# Patient Record
Sex: Male | Born: 1985 | Race: White | Hispanic: No | Marital: Married | Smoking: Current every day smoker
Health system: Southern US, Community
[De-identification: ages and names within clinical notes are randomized; demographics above are authoritative.]

## PROBLEM LIST (undated history)

## (undated) DIAGNOSIS — M1990 Unspecified osteoarthritis, unspecified site: Secondary | ICD-10-CM

## (undated) DIAGNOSIS — K219 Gastro-esophageal reflux disease without esophagitis: Secondary | ICD-10-CM

## (undated) HISTORY — PX: OTHER SURGICAL HISTORY: SHX169

## (undated) HISTORY — DX: Gastro-esophageal reflux disease without esophagitis: K21.9

## (undated) HISTORY — PX: HERNIA REPAIR: SHX51

## (undated) HISTORY — PX: APPENDECTOMY: SHX54

## (undated) HISTORY — DX: Unspecified osteoarthritis, unspecified site: M19.90

---

## 2005-12-26 ENCOUNTER — Encounter: Admission: RE | Admit: 2005-12-26 | Discharge: 2005-12-26 | Payer: Self-pay | Admitting: Surgery

## 2006-02-12 ENCOUNTER — Emergency Department (HOSPITAL_COMMUNITY): Admission: EM | Admit: 2006-02-12 | Discharge: 2006-02-12 | Payer: Self-pay | Admitting: Emergency Medicine

## 2006-02-13 ENCOUNTER — Emergency Department (HOSPITAL_COMMUNITY): Admission: EM | Admit: 2006-02-13 | Discharge: 2006-02-13 | Payer: Self-pay | Admitting: Emergency Medicine

## 2006-03-19 ENCOUNTER — Ambulatory Visit: Payer: Self-pay | Admitting: Family Medicine

## 2006-09-03 ENCOUNTER — Ambulatory Visit: Payer: Self-pay | Admitting: Family Medicine

## 2009-07-08 ENCOUNTER — Emergency Department (HOSPITAL_COMMUNITY): Admission: EM | Admit: 2009-07-08 | Discharge: 2009-07-08 | Payer: Self-pay | Admitting: Emergency Medicine

## 2011-03-30 ENCOUNTER — Other Ambulatory Visit: Payer: Self-pay | Admitting: Surgery

## 2011-03-30 ENCOUNTER — Other Ambulatory Visit: Payer: Self-pay | Admitting: Orthopaedic Surgery

## 2011-03-30 DIAGNOSIS — R1032 Left lower quadrant pain: Secondary | ICD-10-CM

## 2011-04-02 ENCOUNTER — Ambulatory Visit
Admission: RE | Admit: 2011-04-02 | Discharge: 2011-04-02 | Disposition: A | Discharge status: Home / Self Care | Payer: Self-pay | Source: Ambulatory Visit | Attending: Surgery | Admitting: Surgery

## 2011-04-02 ENCOUNTER — Other Ambulatory Visit: Payer: Self-pay

## 2011-04-02 DIAGNOSIS — R1032 Left lower quadrant pain: Secondary | ICD-10-CM

## 2011-04-02 MED ORDER — GADOBENATE DIMEGLUMINE 529 MG/ML IV SOLN
14.0000 mL | Freq: Once | INTRAVENOUS | Status: AC | PRN
  Administered 2011-04-02: 14 mL via INTRAVENOUS

## 2011-04-14 ENCOUNTER — Encounter (HOSPITAL_COMMUNITY)
Admission: RE | Admit: 2011-04-14 | Discharge: 2011-04-14 | Disposition: A | Discharge status: Home / Self Care | Payer: Medicaid Other | Source: Ambulatory Visit | Attending: Surgery | Admitting: Surgery

## 2011-04-14 LAB — SURGICAL PCR SCREEN
MRSA, PCR: NEGATIVE
Staphylococcus aureus: NEGATIVE

## 2011-04-17 ENCOUNTER — Other Ambulatory Visit (HOSPITAL_COMMUNITY): Payer: Self-pay

## 2011-04-21 ENCOUNTER — Ambulatory Visit (HOSPITAL_COMMUNITY)
Admission: RE | Admit: 2011-04-21 | Discharge: 2011-04-21 | Disposition: A | Discharge status: Home / Self Care | Payer: Medicaid Other | Source: Ambulatory Visit | Attending: Surgery | Admitting: Surgery

## 2011-04-21 DIAGNOSIS — Z01812 Encounter for preprocedural laboratory examination: Secondary | ICD-10-CM | POA: Insufficient documentation

## 2011-04-21 DIAGNOSIS — Y832 Surgical operation with anastomosis, bypass or graft as the cause of abnormal reaction of the patient, or of later complication, without mention of misadventure at the time of the procedure: Secondary | ICD-10-CM | POA: Insufficient documentation

## 2011-04-21 DIAGNOSIS — T85898A Other specified complication of other internal prosthetic devices, implants and grafts, initial encounter: Secondary | ICD-10-CM | POA: Insufficient documentation

## 2011-04-21 DIAGNOSIS — R109 Unspecified abdominal pain: Secondary | ICD-10-CM | POA: Insufficient documentation

## 2011-04-21 DIAGNOSIS — G8929 Other chronic pain: Secondary | ICD-10-CM | POA: Insufficient documentation

## 2011-05-11 ENCOUNTER — Encounter (INDEPENDENT_AMBULATORY_CARE_PROVIDER_SITE_OTHER): Payer: Self-pay | Admitting: Surgery

## 2011-05-11 NOTE — Op Note (Signed)
NAME:  Kevin [name], Kevin [name]                  ACCOUNT NO.:  [id number]  MEDICAL RECORD NO.:  [id number]           PATIENT TYPE:  O  LOCATION:  [facility]                         FACILITY:  [facility]  PHYSICIAN:  Abigail Miyamoto, M.D. DATE OF BIRTH:  06/26/1986  DATE OF PROCEDURE:  04/21/2011 DATE OF DISCHARGE:  04/21/2011                              OPERATIVE REPORT   PREOPERATIVE DIAGNOSIS:  Chronic left groin pain.  POSTOPERATIVE DIAGNOSIS:  Chronic left groin pain.  PROCEDURE:  Left groin exploration with removal of old Prolene mesh and placement of new Prolene mesh.  SURGEON:  Abigail Miyamoto, MD  ANESTHESIA:  Marcaine 0.5% and bupivacaine.  ESTIMATED BLOOD LOSS:  Minimal.  INDICATIONS:  Kevin [name] is a 25 year old gentleman who had undergone a left inguinal hernia repair with mesh in 2003.  He has had chronic pain since then.  He has had a CAT scan of his abdomen and pelvis which was normal and now recently the MRI which was unremarkable.  There is a question of whether he has nerve entrapment.  He has failed conservative management, so after discussion with him he wished to proceed with groin exploration.  PROCEDURE IN DETAIL:  The patient was brought to the operating room, identified as Kevin [name] [place].  He was placed supine on the operative table and general anesthesia was induced.  His abdomen was then prepped and draped in the usual sterile fashion.  I performed an ilioinguinal nerve block with Marcaine, anesthetized the skin likewise with Marcaine.  I then used bupivacaine as well.  I made a small incision in the patient's left groin through previous scar.  I took this down through Scarpa fascia with electrocautery.  The external oblique fascia was found to be scarred down.  I was, however, able to open it with the electrocautery. The entire length of the incision.  The patient was found to have old Prolene mesh with what appeared to be Prolene sutures.  I removed the entire piece of  mesh in two separate sections.  I was able to peel it off the external oblique fascia.  There was no evidence of a recurrent hernia in the testicular cord structures.  I examined the groin in its entirety and identified no abnormal cysts or visible neuritis.  I could not identify an ilioinguinal nerve.  I felt like it was stuck up underneath the mesh and was removed with the mesh.  After extensive exploration of the groin, decision was made to place a new piece of mesh.  I was able to control the testicular cord structures with the Penrose drain.  I then placed a piece of Parietex ProGrip mesh into the groin around the cord structures.  I did not secure the mesh in with any sutures.  It appeared to stick to the surrounding tissue well.  I then closed the external oblique fascia over top of this with a running 2-0 Vicryl suture.  I then anesthetized the groin further with the bupivacaine.  I closed the Scarpa fascia with interrupted 3-0 Vicryl sutures, closed the skin with running 4-0 Monocryl.  Steri- Strips, gauze, and tape  were then applied.  The patient tolerated the procedure well.  All counts were correct at the end of the procedure. The patient was then extubated in the operating room and taken in a stable condition to recovery room.     Abigail Miyamoto, M.D.     DB/MEDQ  D:  04/21/2011  T:  04/22/2011  Job:  [id number]  Electronically Signed by Abigail Miyamoto M.D. on 05/11/2011 07:05:07 AM

## 2011-06-29 ENCOUNTER — Encounter (INDEPENDENT_AMBULATORY_CARE_PROVIDER_SITE_OTHER): Payer: Self-pay | Admitting: Surgery

## 2011-07-03 ENCOUNTER — Ambulatory Visit (INDEPENDENT_AMBULATORY_CARE_PROVIDER_SITE_OTHER): Payer: Medicaid Other | Admitting: Surgery

## 2011-07-03 ENCOUNTER — Encounter (INDEPENDENT_AMBULATORY_CARE_PROVIDER_SITE_OTHER): Payer: Self-pay | Admitting: Surgery

## 2011-07-03 DIAGNOSIS — Z09 Encounter for follow-up examination after completed treatment for conditions other than malignant neoplasm: Secondary | ICD-10-CM

## 2011-07-03 NOTE — Progress Notes (Signed)
Subjective:     Patient ID: Kevin [name], male   DOB: [birth year], 25 y.o.   MRN: [id number]  HPI He is here today for a followup visit. He still has occasional discomfort in his groin especially vigorous activity which is involved in a lot at work. This pain again is sharp in nature.  Review of Systems     Objective:   Physical Exam On exam, his incision is well-healed. There is some firmness to the incision especially just above this.    Assessment:     Patient with chronic left groin pain status post hernia repair    Plan:     I did offer him an ilioinguinal nerve block today to see if this would relieve his discomfort he declined. He would continue conservative measures. He will try ice also. I am going to see him back in 6 months for recheck.

## 2011-07-10 ENCOUNTER — Encounter (HOSPITAL_COMMUNITY): Payer: Self-pay | Admitting: *Deleted

## 2011-07-10 ENCOUNTER — Emergency Department (HOSPITAL_COMMUNITY)
Admission: EM | Admit: 2011-07-10 | Discharge: 2011-07-11 | Disposition: A | Discharge status: Home / Self Care | Payer: Medicaid Other | Attending: Emergency Medicine | Admitting: Emergency Medicine

## 2011-07-10 ENCOUNTER — Emergency Department (HOSPITAL_COMMUNITY): Payer: Medicaid Other

## 2011-07-10 DIAGNOSIS — G8918 Other acute postprocedural pain: Secondary | ICD-10-CM

## 2011-07-10 DIAGNOSIS — F17200 Nicotine dependence, unspecified, uncomplicated: Secondary | ICD-10-CM | POA: Insufficient documentation

## 2011-07-10 DIAGNOSIS — R1032 Left lower quadrant pain: Secondary | ICD-10-CM

## 2011-07-10 LAB — POCT I-STAT, CHEM 8
Calcium, Ion: 1.18 mmol/L (ref 1.12–1.32)
Chloride: 105 mEq/L (ref 96–112)
HCT: 44 % (ref 39.0–52.0)
Potassium: 4.6 mEq/L (ref 3.5–5.1)

## 2011-07-10 MED ORDER — HYDROCODONE-ACETAMINOPHEN 5-325 MG PO TABS: ORAL_TABLET | ORAL | Status: AC

## 2011-07-10 MED ORDER — IOHEXOL 300 MG/ML  SOLN
100.0000 mL | Freq: Once | INTRAMUSCULAR | Status: AC | PRN
  Administered 2011-07-10: 100 mL via INTRAVENOUS

## 2011-07-10 MED ORDER — IBUPROFEN 400 MG PO TABS
400.0000 mg | ORAL_TABLET | Freq: Once | ORAL | Status: AC
  Administered 2011-07-10: 400 mg via ORAL
  Filled 2011-07-10: qty 1

## 2011-07-10 MED ORDER — ACETAMINOPHEN 500 MG PO TABS
1000.0000 mg | ORAL_TABLET | Freq: Once | ORAL | Status: AC
  Administered 2011-07-10: 1000 mg via ORAL
  Filled 2011-07-10: qty 2

## 2011-07-10 MED ORDER — SODIUM CHLORIDE 0.9 % IV SOLN
INTRAVENOUS | Status: DC
  Administered 2011-07-10: 21:00:00 via INTRAVENOUS

## 2011-07-10 NOTE — ED Provider Notes (Signed)
History     CSN: [id number] Arrival date & time: 07/10/2011  7:30 PM  Chief Complaint  Patient presents with  . Abdominal Pain   HPI Pt was seen at 2010.  Per pt, c/o gradual onset and persistence of constant pain and "bulge" above his left inguinal hernia repair site for the past several months, worse over the past 2 weeks.  Describes the pain as "sharp," worsens with movement of his torso.  Pt states he has been eval by his Surgeon for this last week, and was told he was "fine," but he came to ED for further eval.  Denies N/V/D, no fevers, no rash, no flank/back pain, no penile drainage, no dysuria/hematuria, no testicular pain/swelling, no CP/SOB.     History reviewed. No pertinent past medical history.  Past Surgical History  Procedure Date  . Appendectomy   . Hernia repair     x 2    Family History  Problem Relation Age of Onset  . Diabetes Mother     History  Substance Use Topics  . Smoking status: Current Everyday Smoker -- 1.0 packs/day    Types: Cigarettes  . Smokeless tobacco: Current User  . Alcohol Use: No    Review of Systems ROS: Statement: All systems negative except as marked or noted in the HPI; Constitutional: Negative for fever and chills. ; ; Eyes: Negative for eye pain and discharge. ; ; ENMT: Negative for ear pain, hoarseness, nasal congestion, sinus pressure and sore throat. ; ; Cardiovascular: Negative for chest pain, palpitations, diaphoresis, dyspnea and peripheral edema. ; ; Respiratory: Negative for cough, wheezing and stridor. ; ; Gastrointestinal: Negative for nausea, vomiting, diarrhea and abdominal pain. +pain left inguinal area surgical site. ; Genitourinary: Negative for dysuria or hematuria, no flank pain and hematuria. No testicular pain/swelling, no penile drainage. ; Musculoskeletal: Negative for back pain and neck pain. ; ; Skin: Negative for rash and skin lesion. ; ; Neuro: Negative for headache, lightheadedness and neck stiffness.     Physical Exam  BP 136/74  Pulse 72  Temp(Src) 98.1 F (36.7 C) (Oral)  Resp 20  Ht 5\' 7"  (1.702 m)  Wt 168 lb (76.204 kg)  BMI 26.31 kg/m2  SpO2 100%  Physical Exam 2015: Physical examination:  Nursing notes reviewed; Vital signs and O2 SAT reviewed;  Constitutional: Well developed, Well nourished, Well hydrated, In no acute distress; Head:  Normocephalic, atraumatic; Eyes: EOMI, PERRL, No scleral icterus; ENMT: Mouth and pharynx normal, Mucous membranes moist; Neck: Supple, Full range of motion, No lymphadenopathy; Cardiovascular: Regular rate and rhythm, No murmur, rub, or gallop; Respiratory: Breath sounds clear & equal bilaterally, No rales, rhonchi, wheezes, or rub, Normal respiratory effort/excursion; Chest: Nontender, Movement normal; Abdomen: Soft, Nontender, Nondistended, Normal bowel sounds; Genitourinary: Genital performed with pt permission and male ED Tech chaparone present during exam.  No perineal erythema.  No penile lesions or drainage.  No scrotal erythema, edema or tenderness to palp.  Normal testicular lie.  No testicular tenderness to palp.  +cremasteric reflexes bilat.  No inguinal LAN or palpable masses.  +well healed surgical scar left inguinal area without erythema, drainage, or fluctuance.  +small nodular area just proximal to surgical scar, NT, no overlying erythema or ecchymosis.  Extremities: Pulses normal, No tenderness, No edema, No calf edema or asymmetry.; Neuro: AA&Ox3, Major CN grossly intact.  Gait steady.  No gross focal motor or sensory deficits in extremities.; Skin: Color normal, Warm, Dry.   ED Course  Procedures  MDM  MDM Reviewed: nursing note and vitals Interpretation: labs and CT scan   Results for orders placed during the hospital encounter of 07/10/11  POCT I-STAT, CHEM 8      Component Value Range   Sodium 141  135 - 145 (mEq/L)   Potassium 4.6  3.5 - 5.1 (mEq/L)   Chloride 105  96 - 112 (mEq/L)   BUN 15  6 - 23 (mg/dL)    Creatinine, Ser 1.61  0.50 - 1.35 (mg/dL)   Glucose, Bld 93  70 - 99 (mg/dL)   Calcium, Ion 0.96  0.45 - 1.32 (mmol/L)   TCO2 27  0 - 100 (mmol/L)   Hemoglobin 15.0  13.0 - 17.0 (g/dL)   HCT 40.9  81.1 - 91.4 (%)   Ct Abdomen Pelvis W Contrast  07/10/2011  *RADIOLOGY REPORT*  Clinical Data: Left inguinal hernia repairs.  Left groin pain. Abdominal pain.  CT ABDOMEN AND PELVIS WITH CONTRAST  Technique:  Multidetector CT imaging of the abdomen and pelvis was performed following the standard protocol during bolus administration of intravenous contrast.  Contrast: 100 ml Omnipaque-300  Comparison: 04/02/2011 MRI; 02/12/2006 CT scan  Findings: The liver, spleen, pancreas, and adrenal glands appear unremarkable.  The gallbladder and biliary system appear unremarkable.  The kidneys appear unremarkable, as do the proximal ureters.  No pathologic retroperitoneal or porta hepatis adenopathy is identified.  No pathologic pelvic adenopathy is identified.  Urinary bladder appears unremarkable.  Mild postoperative scarring in the left inguinal region noted, without recurrent hernia identified.  There is some chronic asymmetry of the rectus abdominus musculature, with the left larger than right.  IMPRESSION:  1.  Scarring along the inguinal regions, left greater than right, from prior hernia repair. 2.  Asymmetry of the inferior portions of the rectus abdominis muscles, chronic, with the left larger than the right. 3.  No specific abnormality is identified to account the patient's chronic left groin pain.  Original Report Authenticated By: Dellia Cloud, M.D.   11:37 PM:  CT A/P and labs normal.  No signs of recurrent hernia.  Will tx symptomatically for now.  Dx testing d/w pt.  Questions answered.  Verb understanding, agreeable to d/c home with outpt f/u.  Shahed Yeoman Allison Quarry, DO 07/11/11 1341

## 2011-07-10 NOTE — ED Notes (Signed)
Pt c/o pain and bulging in his left lower abdomen. Pt states that he has had hernia surgery x 2 previously and the bulging is above the incision sites. Pt denies nausea, vomiting and diarrhea.

## 2011-07-10 NOTE — ED Notes (Signed)
Pt sleeping in bed in room no noted distress. 

## 2011-07-11 NOTE — ED Notes (Signed)
Pt self ambulated out with a steady gait sdtating no needs

## 2011-07-25 ENCOUNTER — Encounter (INDEPENDENT_AMBULATORY_CARE_PROVIDER_SITE_OTHER): Payer: Self-pay | Admitting: Surgery

## 2012-01-01 ENCOUNTER — Ambulatory Visit (INDEPENDENT_AMBULATORY_CARE_PROVIDER_SITE_OTHER): Payer: Self-pay | Admitting: Surgery

## 2012-01-01 ENCOUNTER — Encounter (INDEPENDENT_AMBULATORY_CARE_PROVIDER_SITE_OTHER): Payer: Self-pay | Admitting: Surgery

## 2012-01-01 VITALS — BP 121/62 | HR 82 | Temp 97.8°F | Resp 18 | Ht 67.0 in | Wt 170.6 lb

## 2012-01-01 DIAGNOSIS — G8929 Other chronic pain: Secondary | ICD-10-CM

## 2012-01-01 DIAGNOSIS — R109 Unspecified abdominal pain: Secondary | ICD-10-CM

## 2012-01-01 NOTE — Progress Notes (Signed)
Subjective:     Patient ID: Kevin [name], male   DOB: July 06, 1986, 26 y.o.   MRN: [id number]  HPI He is here for long-term followup. It has been 6 months as I saw him last. He still does a lot of heavy lifting. He still has occasional discomfort in the left groin but is not every day. He denies any bulge. It will occasionally be sharp in nature.  Review of Systems     Objective:   Physical Exam    On exam, his incision is well-healed. It is nontender today. There is no evidence of recurrence. Assessment:     Patient with chronic left groin pain status post hernia repair    Plan:     From a surgical standpoint, there is nothing further to offer. He understands this may be permanent. He is not interested in seeing chronic pain physicians and not interested in any medications as this is not limiting his lifestyle. I will see him back as needed

## 2012-04-04 ENCOUNTER — Emergency Department
Admission: EM | Admit: 2012-04-04 | Discharge: 2012-04-04 | Disposition: A | Discharge status: Home / Self Care | Payer: Self-pay | Source: Home / Self Care | Attending: Emergency Medicine | Admitting: Emergency Medicine

## 2012-04-04 ENCOUNTER — Emergency Department
Admit: 2012-04-04 | Discharge: 2012-04-04 | Disposition: A | Discharge status: Home / Self Care | Payer: Self-pay | Attending: Emergency Medicine | Admitting: Emergency Medicine

## 2012-04-04 ENCOUNTER — Encounter: Payer: Self-pay | Admitting: *Deleted

## 2012-04-04 DIAGNOSIS — M25579 Pain in unspecified ankle and joints of unspecified foot: Secondary | ICD-10-CM

## 2012-04-04 NOTE — ED Notes (Signed)
Patient reports twisting his left foot/ankle on steps 4 days ago while running in the rain. Swelling and pain present.

## 2012-04-04 NOTE — ED Provider Notes (Signed)
History     CSN: [id number]  Arrival date & time 04/04/12  1113   First MD Initiated Contact with Patient 04/04/12 1136      Chief Complaint  Patient presents with  . Leg Pain    right    (Consider location/radiation/quality/duration/timing/severity/associated sxs/prior treatment) HPI This patient comes in today complaining of right ankle and might pain.  He was in the rain 4 days ago and slipped and fell in his right ankle and shin hit the stairs and got wedged in.  Since then he has had pain located mostly on the medial aspect of his right ankle and in his shin area.  He has used some Advil which has helped but has not used ice.  He did used to be in [employer] and states that he had a stress fracture on the opposite side but has never hurt the right ankle or leg before.  He feels it as a sharp throbbing sensation which is worse with certain movements of his ankle.  History reviewed. No pertinent past medical history.  Past Surgical History  Procedure Date  . Appendectomy   . Hernia repair     x 2    Family History  Problem Relation Age of Onset  . Diabetes Mother   . COPD Mother     History  Substance Use Topics  . Smoking status: Current Everyday Smoker -- 1.0 packs/day for 8 years    Types: Cigarettes  . Smokeless tobacco: Current User  . Alcohol Use: No      Review of Systems  All other systems reviewed and are negative.    Allergies  Review of patient's allergies indicates no known allergies.  Home Medications   Current Outpatient Rx  Name Route Sig Dispense Refill  . ACETAMINOPHEN 500 MG PO TABS Oral Take 1,000 mg by mouth as needed. For pain     . MULTIVITAMIN PO Oral Take 1 tablet by mouth daily.        BP 113/72  Pulse 69  Resp 16  Ht 5\' 7"  (1.702 m)  Wt 170 lb (77.111 kg)  BMI 26.63 kg/m2  SpO2 99%  Physical Exam  Nursing note and vitals reviewed. Constitutional: He is oriented to person, place, and time. He appears well-developed and  well-nourished.  Non-toxic appearance. He appears distressed (with ambulation -- antalgic gait).  HENT:  Head: Normocephalic and atraumatic.  Eyes: No scleral icterus.  Neck: Neck supple.  Cardiovascular: Regular rhythm and normal heart sounds.   Pulmonary/Chest: Effort normal and breath sounds normal. No respiratory distress.  Musculoskeletal:       R ankle/foot: FROM, +TTP medial>lateral malleolus and mildly medial aspect of anterior tibia.   No TTP navicular, base of 5th, calcaneus, Achilles, proximal fibula, or ATFL.  + Mild swelling distally.  No ecchymoses.  Distal neurovascular status is intact.   Neurological: He is alert and oriented to person, place, and time.  Skin: Skin is warm and dry.  Psychiatric: He has a normal mood and affect. His speech is normal.    ED Course  Procedures (including critical care time)  Labs Reviewed - No data to display Dg Ankle Complete Right  04/04/2012  *RADIOLOGY REPORT*  Clinical Data: Fall, pain.  RIGHT ANKLE - COMPLETE 3+ VIEW  Comparison: None.  Findings: Imaged bones, joints and soft tissues appear normal.  IMPRESSION: Negative exam.  Original Report Authenticated By: Bernadene Bell. D'ALESSIO, M.D.     1. Pain, joint, ankle and foot  MDM   An x-ray was ordered and read by the radiologist as above.  Likely bony contusions, not sprained. Encourage rest, ice, compression with ACE bandage and/or a brace, and elevation of injured body part.  The role of anti-inflammatories is discussed with the patient.  I discussed with him that he needs to probably give it about 7-10 more days and is still hurting by then will follow up with Dr. Pearletha Forge in sports medicine for further evaluation.   Marlaine Hind, MD 04/04/12 1209

## 2013-09-08 DIAGNOSIS — G5600 Carpal tunnel syndrome, unspecified upper limb: Secondary | ICD-10-CM | POA: Insufficient documentation

## 2013-10-20 ENCOUNTER — Emergency Department (HOSPITAL_COMMUNITY)
Admission: EM | Admit: 2013-10-20 | Discharge: 2013-10-20 | Disposition: A | Discharge status: Home / Self Care | Payer: Self-pay | Attending: Emergency Medicine | Admitting: Emergency Medicine

## 2013-10-20 ENCOUNTER — Encounter (HOSPITAL_COMMUNITY): Payer: Self-pay | Admitting: Emergency Medicine

## 2013-10-20 DIAGNOSIS — H579 Unspecified disorder of eye and adnexa: Secondary | ICD-10-CM | POA: Insufficient documentation

## 2013-10-20 DIAGNOSIS — F17200 Nicotine dependence, unspecified, uncomplicated: Secondary | ICD-10-CM | POA: Insufficient documentation

## 2013-10-20 NOTE — ED Notes (Signed)
PT stated " Am I going to see an actual Dr.? I don't want to be charged for one if I see a PA. How am I getting charged ?"

## 2013-10-20 NOTE — ED Notes (Signed)
Pt. Stated, i was fixing something in the Bathroom and something got in my rt. Eye.

## 2013-10-20 NOTE — ED Notes (Signed)
Pt left without discharge instructions.

## 2013-10-20 NOTE — ED Provider Notes (Signed)
CSN: [id number]     Arrival date & time 10/20/13  1713 History  This chart was scribed for Trixie Dredge, PA-C, working with Shon Baton, MD by Blanchard Kelch, ED Scribe. This patient was seen in room TR04C/TR04C and the patient's care was started at 6:10 PM.    Chief Complaint  Patient presents with  . Eye Injury    Patient is a 27 y.o. male presenting with eye injury. The history is provided by the patient. No language interpreter was used.  Eye Injury    HPI Comments: Kevin [name] is a 27 y.o. male who presents to the Emergency Department complaining of constant, mild eye discomfort that began six days ago. He describes the pain as an irritating feeling. He believes there may be a foreign body stuck in his eye. He denies that the FB has moved or grown. He denies any associated vision changes or discharge. He was burning brush at his house six days ago and may have gotten brush in his eye. States he is worried it is worm. He denies being in any freshwater streams, lakes or water recently. He states he has had a foreign body stuck in his eye years ago and had to get it flushed and wear a patch. However, he denies any similar eye problems to the currently CC in the past. He does not wear contacts and reports that he has 20/20 vision.    History reviewed. No pertinent past medical history. Past Surgical History  Procedure Laterality Date  . Appendectomy    . Hernia repair      x 2   Family History  Problem Relation Age of Onset  . Diabetes Mother   . COPD Mother    History  Substance Use Topics  . Smoking status: Current Every Day Smoker -- 1.00 packs/day for 8 years    Types: Cigarettes  . Smokeless tobacco: Current User  . Alcohol Use: No    Review of Systems  Constitutional: Negative for fever and chills.  Eyes: Positive for pain (irritation). Negative for discharge and visual disturbance.    Allergies  Review of patient's allergies indicates no known  allergies.  Home Medications   Current Outpatient Rx  Name  Route  Sig  Dispense  Refill  . acetaminophen (TYLENOL) 500 MG tablet   Oral   Take 1,000 mg by mouth every 8 (eight) hours as needed for mild pain. For pain         . HYDROcodone-acetaminophen (NORCO) 10-325 MG per tablet   Oral   Take 1 tablet by mouth every 6 (six) hours as needed for moderate pain.         . Multiple Vitamin (MULTIVITAMIN PO)   Oral   Take 1 tablet by mouth daily.            Triage Vitals: BP 123/79  Pulse 94  Temp(Src) 98.8 F (37.1 C)  SpO2 95%  Physical Exam  Nursing note and vitals reviewed. Constitutional: He appears well-developed and well-nourished. No distress.  HENT:  Head: Normocephalic and atraumatic.  Eyes: EOM are normal. Pupils are equal, round, and reactive to light. Right eye exhibits no discharge. Left eye exhibits no discharge. No scleral icterus.  Clear raised ring over right sclera at 8 o clock. No discharge, no injection.  Neck: Neck supple.  Pulmonary/Chest: Effort normal.  Neurological: He is alert.  Skin: He is not diaphoretic.    ED Course  Procedures (including critical care time)  DIAGNOSTIC STUDIES: Oxygen Saturation is 95% on room air, adequate by my interpretation.    COORDINATION OF CARE: 6:15 PM -Will consult with Dr. Wilkie Aye for treatment plan. Patient verbalizes understanding and agrees with treatment plan.    Labs Review Labs Reviewed - No data to display Imaging Review No results found.  EKG Interpretation   None       MDM   1. Eye lesion    Patient with clear round lesion within the sclera with central clearing. His and is not injected, he has no significant discomfort, there is a discharge. He has no systemic symptoms. He has no history of foreign body intruding into his eye. It is unclear what the lesion is. I was not able to perform a full eye exam with fluorescein stain as patient told the nurse after I had seen him that he  preferred to see a doctor. He was then seen by Dr. Wilkie Aye. Please see her notes for full details. Patient stated he had no visual changes and therefore declined to do visual acuity testing. After being seen by Dr. Wilkie Aye he did not want to stay for further workup. Do not think that the patient has a corneal abrasion given his history and the lesion that is present in his eye. I have referred him to an ophthalmologist for further evaluation and treatment. Return precautions given.   I personally performed the services described in this documentation, which was scribed in my presence. The recorded information has been reviewed and is accurate.    Trixie Dredge, PA-C 10/20/13 2159

## 2013-10-20 NOTE — ED Notes (Signed)
Informed ED PAc that Pt is requesting to see a MD.

## 2013-10-20 NOTE — ED Notes (Signed)
Pt refuses to do Visual acuity.

## 2013-10-21 NOTE — ED Provider Notes (Signed)
Medical screening examination/treatment/procedure(s) were conducted as a shared visit with non-physician practitioner(s) and myself.  I personally evaluated the patient during the encounter.  EKG Interpretation   None       Patient presents with right eye discomfort that began 6 days ago. He denies any injury. He feels like something is in his eye. Patient was evaluated by Fidela Salisbury, PA.  Patient is refusing visual field testing or floor seen staining. He is requesting to see a Dr. On my evaluation, the patient continues to refuse full ophthalmologic exam.  he does have a small irregularity at 9:00 of the sclera. There is no chemosis or conjunctival hemorrhage.  The patient was encouraged to submit to a full ophthalmologic exam to rule out abrasion, foreign body. Patient's continuing to refuse. He will be referred to ophthalmology for further evaluation.   Shon Baton, MD 10/21/13 [phone]

## 2014-02-23 ENCOUNTER — Emergency Department (HOSPITAL_COMMUNITY): Payer: Self-pay

## 2014-02-23 ENCOUNTER — Emergency Department (INDEPENDENT_AMBULATORY_CARE_PROVIDER_SITE_OTHER)
Admission: EM | Admit: 2014-02-23 | Discharge: 2014-02-23 | Disposition: A | Discharge status: Home / Self Care | Payer: Self-pay | Source: Home / Self Care | Attending: Emergency Medicine | Admitting: Emergency Medicine

## 2014-02-23 ENCOUNTER — Encounter (HOSPITAL_COMMUNITY): Payer: Self-pay | Admitting: Emergency Medicine

## 2014-02-23 ENCOUNTER — Emergency Department (INDEPENDENT_AMBULATORY_CARE_PROVIDER_SITE_OTHER): Payer: Self-pay

## 2014-02-23 DIAGNOSIS — J189 Pneumonia, unspecified organism: Secondary | ICD-10-CM

## 2014-02-23 LAB — POCT RAPID STREP A: Streptococcus, Group A Screen (Direct): NEGATIVE

## 2014-02-23 MED ORDER — GUAIFENESIN-CODEINE 100-10 MG/5ML PO SOLN: 10.0000 mL | ORAL | Status: DC | PRN

## 2014-02-23 MED ORDER — LIDOCAINE HCL (PF) 1 % IJ SOLN
INTRAMUSCULAR | Status: AC
  Filled 2014-02-23: qty 5

## 2014-02-23 MED ORDER — CEFTRIAXONE SODIUM 1 G IJ SOLR
1.0000 g | Freq: Once | INTRAMUSCULAR | Status: AC
  Administered 2014-02-23: 1 g via INTRAMUSCULAR

## 2014-02-23 MED ORDER — DEXAMETHASONE SODIUM PHOSPHATE 10 MG/ML IJ SOLN
INTRAMUSCULAR | Status: AC
  Filled 2014-02-23: qty 1

## 2014-02-23 MED ORDER — DEXAMETHASONE SODIUM PHOSPHATE 10 MG/ML IJ SOLN
10.0000 mg | Freq: Once | INTRAMUSCULAR | Status: AC
  Administered 2014-02-23: 10 mg via INTRAMUSCULAR

## 2014-02-23 MED ORDER — PREDNISONE 10 MG PO TABS: ORAL_TABLET | ORAL | Status: DC

## 2014-02-23 MED ORDER — CEFTRIAXONE SODIUM 1 G IJ SOLR
INTRAMUSCULAR | Status: AC
  Filled 2014-02-23: qty 10

## 2014-02-23 MED ORDER — AZITHROMYCIN 250 MG PO TABS: ORAL_TABLET | ORAL | Status: DC

## 2014-02-23 NOTE — Discharge Instructions (Signed)

## 2014-02-23 NOTE — ED Notes (Signed)
C/o cold sx which started friday States he has chills, fever, dry cough, nasal drainage States chest and throat hurts due to coughing advil was taken

## 2014-02-23 NOTE — ED Provider Notes (Signed)
Medical screening examination/treatment/procedure(s) were performed by non-physician practitioner and as supervising physician I was immediately available for consultation/collaboration.  Leslee Homeavid Romuald Mccaslin, M.D.  Reuben Likesavid C Tres Grzywacz, MD 02/23/14 2222

## 2014-02-23 NOTE — ED Provider Notes (Signed)
CSN: [id number]     Arrival date & time 02/23/14  0825 History   None    Chief Complaint  Patient presents with  . URI   (Consider location/radiation/quality/duration/timing/severity/associated sxs/prior Treatment) HPI Comments: 28 year old male presents complaining of fever to 101, sore throat, nasal congestion, body aches, chills, dry cough, right-sided pleuritic chest pain, generalized fatigue and weakness. This began on Friday. His symptoms continued to worsen up to today, although he says the fever seems to be gone today. He still feels very ill. He has been taking over-the-counter cough and cold medications with minimal relief of his symptoms. He has a sick contact with strep throat, although he states that his sore throat is actually better now. Feels like his symptoms are primarily localized to his chest and lungs now.  Patient is a 28 y.o. male presenting with URI.  URI Presenting symptoms: congestion, cough, fatigue, fever, rhinorrhea and sore throat   Presenting symptoms: no ear pain   Associated symptoms: myalgias   Associated symptoms: no arthralgias and no neck pain     History reviewed. No pertinent past medical history. Past Surgical History  Procedure Laterality Date  . Appendectomy    . Hernia repair      x 2   Family History  Problem Relation Age of Onset  . Diabetes Mother   . COPD Mother    History  Substance Use Topics  . Smoking status: Current Every Day Smoker -- 1.00 packs/day for 8 years    Types: Cigarettes  . Smokeless tobacco: Current User  . Alcohol Use: No    Review of Systems  Constitutional: Positive for fever, chills and fatigue.  HENT: Positive for congestion, rhinorrhea, sinus pressure and sore throat. Negative for ear pain.   Eyes: Negative for visual disturbance.  Respiratory: Positive for cough, chest tightness and shortness of breath.   Cardiovascular: Positive for chest pain (pleuritic). Negative for palpitations and leg swelling.   Gastrointestinal: Negative for nausea, vomiting, abdominal pain, diarrhea and constipation.  Genitourinary: Negative for dysuria, urgency, frequency, hematuria and flank pain.  Musculoskeletal: Positive for myalgias. Negative for arthralgias, back pain, neck pain and neck stiffness.  Skin: Negative for rash.  Neurological: Negative for dizziness, weakness and light-headedness.    Allergies  Review of patient's allergies indicates no known allergies.  Home Medications   Current Outpatient Rx  Name  Route  Sig  Dispense  Refill  . acetaminophen (TYLENOL) 500 MG tablet   Oral   Take 1,000 mg by mouth every 8 (eight) hours as needed for mild pain. For pain         . azithromycin (ZITHROMAX Z-PAK) 250 MG tablet      Use as directed   6 each   0   . guaiFENesin-codeine 100-10 MG/5ML syrup   Oral   Take 10 mLs by mouth every 4 (four) hours as needed for cough.   120 mL   0   . HYDROcodone-acetaminophen (NORCO) 10-325 MG per tablet   Oral   Take 1 tablet by mouth every 6 (six) hours as needed for moderate pain.         . Multiple Vitamin (MULTIVITAMIN PO)   Oral   Take 1 tablet by mouth daily.           . predniSONE (DELTASONE) 10 MG tablet      4 tabs PO QD for 4 days;  3 tabs PO QD for 3 days;  2 tabs PO QD for 2 days;  1 tab PO QD for 1 day   30 tablet   0    BP 127/72  Pulse 77  Temp(Src) 98.4 F (36.9 C) (Oral)  Resp 16  SpO2 98% Physical Exam  Nursing note and vitals reviewed. Constitutional: He is oriented to person, place, and time. He appears well-developed and well-nourished. No distress.  HENT:  Head: Normocephalic and atraumatic.  Nose: Mucosal edema present. Right sinus exhibits no maxillary sinus tenderness and no frontal sinus tenderness. Left sinus exhibits no maxillary sinus tenderness and no frontal sinus tenderness.  Mouth/Throat: Uvula is midline and mucous membranes are normal. Posterior oropharyngeal erythema present. No oropharyngeal  exudate.  Cardiovascular: Normal rate, regular rhythm and normal heart sounds.  Exam reveals no gallop and no friction rub.   No murmur heard. Pulmonary/Chest: Effort normal and breath sounds normal. No respiratory distress. He has no wheezes. He has no rales. He exhibits no tenderness.  Abdominal: Soft. There is no tenderness.  Lymphadenopathy:       Head (right side): No submandibular and no tonsillar adenopathy present.       Head (left side): No submandibular and no tonsillar adenopathy present.    He has no cervical adenopathy.       Right: No supraclavicular adenopathy present.       Left: No supraclavicular adenopathy present.  Neurological: He is alert and oriented to person, place, and time. Coordination normal.  Skin: Skin is warm and dry. No rash noted. He is not diaphoretic.  Psychiatric: He has a normal mood and affect. Judgment normal.    ED Course  Procedures (including critical care time) Labs Review Labs Reviewed  POCT RAPID STREP A ([facility]RG CARE ONLY)   Imaging Review Dg Chest 2 View  02/23/2014   CLINICAL DATA:  Chest pain, cough and congestion.  EXAM: CHEST - 2 VIEW  COMPARISON:  DG CHEST 2V dated 02/14/2012  FINDINGS: Perihilar bronchial thickening present. The lungs are normally expanded. Mild area of potentially early infiltrate versus atelectasis is present in the right infrahilar and lower lung. No pulmonary edema, nodule or pleural fluid is identified. No evidence of pneumothorax. Cardiac and mediastinal contours are within normal limits. Bony thorax is unremarkable.  IMPRESSION: Bronchial thickening with early infiltrate versus atelectasis in the right infrahilar/lower lung.   Electronically Signed   By: Irish Lack M.D.   On: 02/23/2014 09:28     MDM   1. CAP (community acquired pneumonia)    CXR and Hx consistent with CAP.  Giving rocephin and decadron here, discharging with azithromycin, prednisone, cheratussin.  Scheduled f/u here in 2 days for  re-check.  ED if worsening.     Meds ordered this encounter  Medications  . cefTRIAXone (ROCEPHIN) injection 1 g    Sig:   . dexamethasone (DECADRON) injection 10 mg    Sig:   . azithromycin (ZITHROMAX Z-PAK) 250 MG tablet    Sig: Use as directed    Dispense:  6 each    Refill:  0    Order Specific Question:  Supervising Provider    Answer:  Lorenz Coaster, DAVID C [id number]  . predniSONE (DELTASONE) 10 MG tablet    Sig: 4 tabs PO QD for 4 days;  3 tabs PO QD for 3 days;  2 tabs PO QD for 2 days;  1 tab PO QD for 1 day    Dispense:  30 tablet    Refill:  0    Order Specific Question:  Supervising Provider  Answer:  Lorenz Coaster, DAVID C [[id number]]  . guaiFENesin-codeine 100-10 MG/5ML syrup    Sig: Take 10 mLs by mouth every 4 (four) hours as needed for cough.    Dispense:  120 mL    Refill:  0    Order Specific Question:  Supervising Provider    Answer:  Lorenz Coaster, DAVID C [[id number]]       Graylon Good, PA-C 02/23/14 [phone]

## 2014-02-25 LAB — CULTURE, GROUP A STREP

## 2017-05-28 ENCOUNTER — Encounter (HOSPITAL_COMMUNITY): Payer: Self-pay

## 2017-05-28 ENCOUNTER — Ambulatory Visit (INDEPENDENT_AMBULATORY_CARE_PROVIDER_SITE_OTHER): Payer: Self-pay | Admitting: Orthopaedic Surgery

## 2017-05-28 ENCOUNTER — Emergency Department (HOSPITAL_COMMUNITY)
Admission: EM | Admit: 2017-05-28 | Discharge: 2017-05-28 | Disposition: A | Discharge status: Home / Self Care | Payer: Self-pay | Attending: Emergency Medicine | Admitting: Emergency Medicine

## 2017-05-28 ENCOUNTER — Emergency Department (HOSPITAL_COMMUNITY): Payer: Self-pay

## 2017-05-28 ENCOUNTER — Ambulatory Visit (INDEPENDENT_AMBULATORY_CARE_PROVIDER_SITE_OTHER): Payer: Self-pay

## 2017-05-28 DIAGNOSIS — M25511 Pain in right shoulder: Secondary | ICD-10-CM | POA: Insufficient documentation

## 2017-05-28 DIAGNOSIS — G8929 Other chronic pain: Secondary | ICD-10-CM

## 2017-05-28 DIAGNOSIS — F17210 Nicotine dependence, cigarettes, uncomplicated: Secondary | ICD-10-CM | POA: Insufficient documentation

## 2017-05-28 MED ORDER — PREDNISONE 10 MG (21) PO TBPK: ORAL_TABLET | ORAL | 0 refills | Status: DC

## 2017-05-28 NOTE — Discharge Instructions (Signed)
Please take Ibuprofen as needed for pain--follow over the counter label instructions--take with food to prevent GI upset.Please call to schedule a follow up appointment with your primary care doctor regarding today's ER visit, as well as orthopedics (please see listed number). Apply ice for 20 minutes four times a day--do not apply ice directly to skin--cover with towel. Return to the ER if you experience fevers, chills, unexplained weight loss, dizziness, vision or gait changes, chest pain, shortness of breath, abdominal pain, nausea, vomiting, diarrhea, extremity numbness or tingling, extremity weakness, redness/swelling/warmth to shoulder, worsening symptoms, or any additional concerns.

## 2017-05-28 NOTE — ED Triage Notes (Addendum)
Per Pt, pt is coming from home with complaints of right shoulder pain that started over a month ago and has gotten worse over time with movement.

## 2017-05-28 NOTE — ED Provider Notes (Signed)
[facility] Emergency Department Provider Note By signing my name below, I, Kevin [name], attest that this documentation has been prepared under the direction and in the presence of Wille Glaser, NP. Electronically Signed: Sonum [name], Neurosurgeon. 05/28/17. 10:11 AM.  ED Clinical Impression   Acute pain of right shoulder   History   Chief Complaint Shoulder Pain   HPI  Patient is a 31 y.o. male presenting to the ED for right shoulder pain, initial onset approx 1 month ago, gradually worsening, states the pain is worse with movement, has not tried OTC meds. States he works in [employer] with frequent heavy lifting and arm use, denies any known injury or trauma to the affected area. He is right hand dominant. Denies extremity numbness or tingling. Denies fevers, chills, unexplained weight loss, dizziness, vision or gait changes, headaches, neck pain, CP, SOB, cough, abd pain, n/v/d, extremity numbness/tingling, extremity weakness, or any additional concerns. Hx of right wrist surgeries, no previous shoulder surgeries.    History reviewed. No pertinent past medical history.  Past Surgical History:  Procedure Laterality Date  . APPENDECTOMY    . HERNIA REPAIR     x 2  . Wrist surgery       Current Outpatient Rx  . Order #: [id number] Class: Historical Med  . Order #: [id number] Class: Print  . Order #: [id number] Class: Print  . Order #: [id number] Class: Historical Med  . Order #: [id number] Class: Historical Med  . Order #: [id number] Class: Print    Allergies Patient has no known allergies.  Family History  Problem Relation Age of Onset  . Diabetes Mother   . COPD Mother     Social History Social History  Substance Use Topics  . Smoking status: Current Every Day Smoker    Packs/day: 1.00    Years: 8.00    Types: Cigarettes  . Smokeless tobacco: Current User  . Alcohol use No    Review of Systems  Constitutional: Negative for fever, chills, or unexplained weight loss. Eyes:  Negative for visual changes. Cardiovascular: Negative for chest pain or extremity swelling. Respiratory: Negative for shortness of breath or cough. Gastrointestinal: Negative for abdominal pain, nausea, vomiting, or diarrhea. Musculoskeletal: Positive shoulder pain. Negative for back pain or extremity swelling. Skin: Negative for rash. Neurological: Negative for headaches, dizziness, focal weakness, or numbness/tingling.  Physical Exam   VITAL SIGNS:   ED Triage Vitals  Enc Vitals Group     BP 05/28/17 0900 (!) 131/94     Pulse Rate 05/28/17 0900 84     Resp 05/28/17 0900 18     Temp 05/28/17 0900 98.5 F (36.9 C)     Temp Source 05/28/17 0900 Oral     SpO2 05/28/17 0900 98 %     Weight 05/28/17 0901 165 lb (74.8 kg)     Height 05/28/17 0901 5\' 7"  (1.702 m)     Head Circumference --      Peak Flow --      Pain Score 05/28/17 0900 0     Pain Loc --      Pain Edu? --      Excl. in GC? --     Constitutional: Alert and oriented. Well appearing and in no respiratory apparent distress. Eyes: PERRL, EOMI, Conjunctivae normal ENT      Head: Normocephalic and atraumatic.      Ears: TM intact bilaterally without erythema or effusion.      Mouth/Throat: Mucous membranes are moist. Oropharynx without erythema or exudate. Normal voice, handling  secretions normally.      Neck: Supple, no nuchal signs, full active ROM of neck.  Cardiovascular: Normal S1 S2, regular rhythm, normal rate. Normal and symmetric distal pulses are present in all extremities. Respiratory: Breath sounds clear and equal bilaterally. No wheezes, rales, or rhonchi. Normal respiratory effort.  Gastrointestinal: Abdomen soft and nontender. No rebound or guarding. There is no CVA tenderness. Back: No midline tenderness, no stepoff.  Musculoskeletal: +R anterior shoulder mildly tender to palpation, full active ROM with increased pain with abduction, normal adduction, no deformity, skin intact without  erythema/warmth/ecchymosis. No STS. AC joint nontender, clavicle nontender. Negative Hawkins and drop arm test. Sharp and dull sensation grossly intact to bilateral UE. Strength 5/5 to bilateral UE. Radial pulses 2+, cap refill < 2sec. Nontender with normal range of motion in all other extremities. Neurologic: Speech clear. Alert and appropriate, no gross focal neurologic deficits are appreciated. Gait steady with ambulation. Equal strength in all four extremities. Extremities neurovascularly intact.  Skin: Skin is warm, dry, and intact. No rash noted. Psychiatric: Mood and affect are normal. Speech and behavior are normal.  Labs   Labs Reviewed - No data to display  Radiology   DG SHOULDER RIGHT   ED Course, Assessment and Plan  Pt is a 31 y/o M, afebrile, who presents to ED for right shoulder pain, concern for strain. Not c/w septic joint or osteomyelitis. Will get xray for acute fracture. No evidence of neurovascular compromise. Compartments soft to RUE, doubt compartment syndrome. If xray negative for acute fracture/dislocation, will plan to dc with symptomatic tx and orthopedic follow up; pt amenable to this plan.  10:45 AM Xray with minimal acromioclavicular joint bony overgrowth, no acute fracture or dislocation. Discussed results, discharge instructions, rx and safety, return precautions, and follow up. Pt verbalizes understanding using verbal teachback and agrees with plan, denies any additional concerns.   Previous chart, nursing notes, and vital signs reviewed.    Pertinent labs & imaging results that were available during my care of the patient were reviewed by me and considered in my medical decision making (see chart for details).  I personally performed the services described in this documentation, which was scribed in my presence. The recorded information has been reviewed and is accurate.    Wojeck, HopeRobyn K, NP 05/30/17 [id number]    Linwood DibblesKnapp, Jon, MD 05/30/17 [phone]

## 2017-05-28 NOTE — Progress Notes (Signed)
Office Visit Note   Patient: Kevin [name]           Date of Birth: 08/09/1986           MRN: [id number] Visit Date: 05/28/2017              Requested by: No referring provider defined for this encounter. PCP: Patient, No Pcp Per   Assessment & Plan: Visit Diagnoses:  1. Chronic right shoulder pain     Plan: Overall impression is right shoulder subacromial bursitis and impingement. Patient declined injection. I offered him prednisone taper which she agreed to. Also recommend ice and relative rest. Follow-up with me as needed. Questions encouraged and answered.  Follow-Up Instructions: Return if symptoms worsen or fail to improve.   Orders:  Orders Placed This Encounter  Procedures  . XR Shoulder 1V Right   Meds ordered this encounter  Medications  . predniSONE (STERAPRED UNI-PAK 21 TAB) 10 MG (21) TBPK tablet    Sig: Take as directed    Dispense:  21 tablet    Refill:  0      Procedures: No procedures performed   Clinical Data: No additional findings.   Subjective: Chief Complaint  Patient presents with  . Right Shoulder - Pain    Kevin is a healthy 31 year old woman comes in with right shoulder pain that's progressively gotten worse over the last 2 weeks. He has a physically demanding job. He denies any particular injuries. He endorses pain on the superior aspect of the shoulder. Pain does not radiate. Denies any numbness or tingling or neck pain.    Review of Systems  Constitutional: Negative.   All other systems reviewed and are negative.    Objective: Vital Signs: There were no vitals taken for this visit.  Physical Exam  Constitutional: He is oriented to person, place, and time. He appears well-developed and well-nourished.  HENT:  Head: Normocephalic and atraumatic.  Eyes: Pupils are equal, round, and reactive to light.  Neck: Neck supple.  Pulmonary/Chest: Effort normal.  Abdominal: Soft.  Musculoskeletal: Normal range of motion.    Neurological: He is alert and oriented to person, place, and time.  Skin: Skin is warm.  Psychiatric: He has a normal mood and affect. His behavior is normal. Judgment and thought content normal.  Nursing note and vitals reviewed.   Ortho Exam Right shoulder exam shows no significant impingement signs. Rotator cuff testing is strong with moderate pain. No deficits. Acromioclavicular joint is tender. Mildly positive cross adduction sign. Specialty Comments:  No specialty comments available.  Imaging: Dg Shoulder Right  Result Date: 05/28/2017 CLINICAL DATA:  31 year old male with right shoulder pain starting 1 month ago getting worse. No reported trauma. Initial encounter. EXAM: RIGHT SHOULDER - 2+ VIEW COMPARISON:  None. FINDINGS: Minimal acromioclavicular joint bony overgrowth. No soft tissue calcification. No fracture or dislocation. Visualized lungs clear. IMPRESSION: Minimal acromioclavicular joint bony overgrowth. Otherwise negative exam. Electronically Signed   By: Lacy DuverneySteven  Olson M.D.   On: 05/28/2017 09:36   Xr Shoulder 1v Right  Result Date: 05/28/2017 No significant acromioclavicular joint arthropathy. small dorsal spur of the acromion    PMFS History: There are no active problems to display for this patient.  No past medical history on file.  Family History  Problem Relation Age of Onset  . Diabetes Mother   . COPD Mother     Past Surgical History:  Procedure Laterality Date  . APPENDECTOMY    . HERNIA REPAIR  x 2  . Wrist surgery      Social History   Occupational History  . Not on file.   Social History Main Topics  . Smoking status: Current Every Day Smoker    Packs/day: 1.00    Years: 8.00    Types: Cigarettes  . Smokeless tobacco: Current User  . Alcohol use No  . Drug use: No  . Sexual activity: Not on file

## 2017-10-09 DIAGNOSIS — Z8739 Personal history of other diseases of the musculoskeletal system and connective tissue: Secondary | ICD-10-CM | POA: Insufficient documentation

## 2017-10-09 NOTE — Progress Notes (Signed)
Subjective: ZO:XWRUEAVWUCC:establish care, back pain HPI: [place] A Jone Basemanurdy is a 31 y.o. male presenting to clinic today for:  1. Back pain Patient reports a long-standing history of low back pain.  He notes that back pain became acutely worse a couple of weeks ago and he sought evaluation in the emergency department.  He had lumbar films performed at that time which were unremarkable.  They proceeded with MRI of the lumbar spine which revealed L5 through S1 disc degeneration and desiccation.  They noted a shallow broad-based central disc protrusion with slight asymmetry possibly contacting the right S1 nerve root.  The rest of the exam was unremarkable.  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  His job often requires him to bend, push, pull, lift heavy objects.  He does note that the low back pain tends to radiate to the right lower extremity.  He denies numbness, tingling, saddle anesthesia, fecal incontinence, urinary retention, weakness.  He was given a steroid burst during that visit.  He did report some improvement of symptoms and notes that they are substantially better than Sunday.  He has never seen a physical therapist or orthopedist for this issue.  He he notes that he would like to avoid surgical intervention if possible.  Not currently taking any medications except for occasional ibuprofen for headaches.  Past Medical History:  Diagnosis Date  . Arthritis   . GERD (gastroesophageal reflux disease)    Past Surgical History:  Procedure Laterality Date  . APPENDECTOMY    . HERNIA REPAIR Left    x 2 inguinal  . Wrist surgery      Social History   Socioeconomic History  . Marital status: Married    Spouse name: Not on file  . Number of children: Not on file  . Years of education: Not on file  . Highest education level: Not on file  Social Needs  . Financial resource strain: Not on file  . Food insecurity - worry: Not on file  . Food insecurity - inability: Not on file    . Transportation needs - medical: Not on file  . Transportation needs - non-medical: Not on file  Occupational History  . Not on file  Tobacco Use  . Smoking status: Current Every Day Smoker    Packs/day: 1.00    Years: 8.00    Pack years: 8.00    Types: Cigarettes  . Smokeless tobacco: Current User  Substance and Sexual Activity  . Alcohol use: No  . Drug use: No  . Sexual activity: Yes  Other Topics Concern  . Not on file  Social History Narrative  . Not on file   No outpatient medications have been marked as taking for the 10/10/17 encounter (Office Visit) with Raliegh IpGottschalk, Daronte Shostak M, DO.   Family History  Problem Relation Age of Onset  . Diabetes Mother   . COPD Mother   . Heart attack Maternal Grandmother   . Heart disease Maternal Grandmother   . Breast cancer Neg Hx   . Prostate cancer Neg Hx   . Ovarian cancer Neg Hx    No Known Allergies   Health Maintenance: Flu shot  ROS: Per HPI  Objective: Office vital signs reviewed. BP 130/84   Pulse 94   Temp 97.7 F (36.5 C) (Oral)   Ht 5\' 7"  (1.702 m)   Wt 179 lb (81.2 kg)   BMI 28.04 kg/m   Physical Examination:  General: Awake, alert, well nourished, well  appearing male, No acute distress Cardio: regular rate and rhythm, S1S2 heard, no murmurs appreciated Pulm: clear to auscultation bilaterally, no wheezes, rhonchi or rales; normal work of breathing on room air Extremities: warm, well perfused, No edema, cyanosis or clubbing; +2 pulses bilaterally MSK: Normal gait and normal station  Lumbar spine: Patient has full active, painless range of motion in all planes.  No midline tenderness to palpation to the entire spine.  No paraspinal muscle tenderness to palpation noted.  No palpable bony abnormalities on exam.  He has negative straight leg raises bilaterally. Neuro: 5/5 lower extremity strength and light touch sensation grossly intact, no focal neurologic deficits.  Assessment/ Plan: 31 y.o. male    Degenerative disc disease at L5-S1 level Copy of MRI disc was provided at today's visit.  [facility].  The report was reviewed which again revealed shallow broad-based disc protrusion at L5 through S1 with slight asymmetric and possible contact to the right S1 nerve root.  This is likely the etiology of patient's symptoms.  He demonstrates no red flag symptoms and is actually fairly asymptomatic during today's visit.  He would like to do everything he can to avoid surgery.  We did discuss consideration for referral to physical therapy.  He is currently uninsured and finances are limiting.  I have initiated meloxicam 7.5-15 mg daily as needed for back pain.  I recommended that he avoid all other NSAIDs, take the medication with a meal and drink plenty of water.  I have also started him on gabapentin 300 mg to be titrated up to 3 times daily as tolerated for the associated sciatica.  If pain persists or worsens, will I did discuss that the next step would be physical therapy and referral to orthopedic surgery for further evaluation and management.  He is working on Community education officerinsurance in anticipation for possible need of this.  He voiced good understanding of plan and will follow-up in the next 4 weeks or sooner if needed.  We will plan to obtain updated CMP at next visit.  Encounter to establish care with new doctor  Sciatica of right side See above.   Raliegh IpAshly M Farrah Skoda, DO [facility] [phone]

## 2017-10-10 ENCOUNTER — Encounter: Payer: Self-pay | Admitting: Family Medicine

## 2017-10-10 ENCOUNTER — Ambulatory Visit (INDEPENDENT_AMBULATORY_CARE_PROVIDER_SITE_OTHER): Payer: Self-pay | Admitting: Family Medicine

## 2017-10-10 VITALS — BP 130/84 | HR 94 | Temp 97.7°F | Ht 67.0 in | Wt 179.0 lb

## 2017-10-10 DIAGNOSIS — Z7689 Persons encountering health services in other specified circumstances: Secondary | ICD-10-CM

## 2017-10-10 DIAGNOSIS — M5137 Other intervertebral disc degeneration, lumbosacral region: Secondary | ICD-10-CM

## 2017-10-10 DIAGNOSIS — M51379 Other intervertebral disc degeneration, lumbosacral region without mention of lumbar back pain or lower extremity pain: Secondary | ICD-10-CM | POA: Insufficient documentation

## 2017-10-10 DIAGNOSIS — M5136 Other intervertebral disc degeneration, lumbar region: Secondary | ICD-10-CM

## 2017-10-10 DIAGNOSIS — M5431 Sciatica, right side: Secondary | ICD-10-CM

## 2017-10-10 DIAGNOSIS — M5430 Sciatica, unspecified side: Secondary | ICD-10-CM | POA: Insufficient documentation

## 2017-10-10 MED ORDER — GABAPENTIN 300 MG PO CAPS: ORAL_CAPSULE | ORAL | 0 refills | Status: DC

## 2017-10-10 MED ORDER — MELOXICAM 15 MG PO TABS: 7.5000 mg | ORAL_TABLET | Freq: Every day | ORAL | 0 refills | Status: DC

## 2017-10-10 NOTE — Patient Instructions (Signed)
Follow up with me in 4 weeks for your back pain.  As we discussed, the next step is physical therapy followed by referral to orthopedic surgery.  I have prescribed 2 medications today: 1. Gabapentin: take 1 capsule at bedtime x3 nights, then increase to twice daily x3 days, then 3 times daily.  This will help with pain that goes down your leg.  2. Meloxicam: Take 1/2-1 tablet daily for pain and inflammation. No motrin, advil, aleve while on this medication.  Take with food and drink plenty of water.

## 2017-10-10 NOTE — Assessment & Plan Note (Signed)
Copy of MRI disc was provided at today's visit.  This imaging study was [facility] with [facility].  The report was reviewed which again revealed shallow broad-based disc protrusion at L5 through S1 with slight asymmetric and possible contact to the right S1 nerve root.  This is likely the etiology of patient's symptoms.  He demonstrates no red flag symptoms and is actually fairly asymptomatic during today's visit.  He would like to do everything he can to avoid surgery.  We did discuss consideration for referral to physical therapy.  He is currently uninsured and finances are limiting.  I have initiated meloxicam 7.5-15 mg daily as needed for back pain.  I recommended that he avoid all other NSAIDs, take the medication with a meal and drink plenty of water.  I have also started him on gabapentin 300 mg to be titrated up to 3 times daily as tolerated for the associated sciatica.  If pain persists or worsens, will I did discuss that the next step would be physical therapy and referral to orthopedic surgery for further evaluation and management.  He is working on Community education officerinsurance in anticipation for possible need of this.  He voiced good understanding of plan and will follow-up in the next 4 weeks or sooner if needed.  We will plan to obtain updated CMP at next visit.

## 2017-10-17 ENCOUNTER — Telehealth: Payer: Self-pay | Admitting: Family Medicine

## 2017-10-17 NOTE — Telephone Encounter (Signed)
As we discussed during our appt.  If he medication makes him drowsy, he should only take at night time until he is no longer having this side effect.  Recommend that he take only at night time for now.  Continue the meloxicam as prescribed.

## 2017-10-17 NOTE — Telephone Encounter (Signed)
Phone call to patient- no answer, unable to leave voicemail. 

## 2017-10-17 NOTE — Telephone Encounter (Signed)
Patient states gabapentin makes him very drowsy and he is nervous to take this during the day due to his work.  He is wondering if there is something for nerve irritation that he can take that will not make him as drowsy, and that he can take as needed?  Please advise.

## 2017-10-17 NOTE — Telephone Encounter (Signed)
Advised patient of advice from Dr. Nadine CountsGottschalk.  He states he does not think the gabapentin is helping him and he does not want to be on a daily regimen.  Advised him that it takes time for the gabapentin to work and many times the dosage has to be adjusted for it to be fully effective.  Advised I will be glad to make him a follow up appointment with Dr. Nadine CountsGottschalk to discuss other options.  He states he is paying out of pocket for his appointments, and will wait to discuss at his appointment 11/07/17.  He asks what he should do when he has pain during the day.  Advised he should take meloxicam for daytime discomfort.

## 2017-11-07 ENCOUNTER — Encounter: Payer: Self-pay | Admitting: Physician Assistant

## 2017-11-07 ENCOUNTER — Ambulatory Visit: Payer: Self-pay | Admitting: Family Medicine

## 2017-11-07 ENCOUNTER — Ambulatory Visit (INDEPENDENT_AMBULATORY_CARE_PROVIDER_SITE_OTHER): Payer: Self-pay | Admitting: Physician Assistant

## 2017-11-07 VITALS — BP 128/84 | HR 94 | Temp 97.9°F | Ht 67.0 in | Wt 181.6 lb

## 2017-11-07 DIAGNOSIS — M5126 Other intervertebral disc displacement, lumbar region: Secondary | ICD-10-CM

## 2017-11-07 MED ORDER — HYDROCODONE-ACETAMINOPHEN 10-325 MG PO TABS: 1.0000 | ORAL_TABLET | Freq: Three times a day (TID) | ORAL | 0 refills | Status: DC | PRN

## 2017-11-07 MED ORDER — GABAPENTIN 100 MG PO CAPS: 100.0000 mg | ORAL_CAPSULE | Freq: Every day | ORAL | 2 refills | Status: DC

## 2017-11-07 MED ORDER — MELOXICAM 15 MG PO TABS: 7.5000 mg | ORAL_TABLET | Freq: Every day | ORAL | 5 refills | Status: DC

## 2017-11-07 NOTE — Patient Instructions (Signed)
Herniated Disk A herniated disk is when a disk in your spine bulges out too far. There is a disk with a spongy center in between each pair of bones in the spine (vertebrae). These disks act as shock absorbers when you move. A herniated disk can cause pain and muscle weakness. This can happen anywhere in the back or neck. Follow these instructions at home: Medicines  Take over-the-counter and prescription medicines only as told by your doctor.  Do not drive or use heavy machinery while taking prescription pain medicine. Activity  Rest as told by your doctor.  After your rest period: ? Return to your normal activities. Slowly start exercising as told by your doctor. Ask what activities are safe for you. ? Use good posture. ? Avoid movements that cause pain. ? Do not lift anything that is heavier than 10 lb (4.5 kg) until your doctor says this is safe. ? Do not sit or stand for a long time without moving. ? Do not sit for a long time without getting up and moving around.  Do exercises (physical therapy) as told.  Try to strengthen your back and belly (abdomen) with exercises like crunches, swimming, or walking. General instructions  Do not use any products that contain nicotine or tobacco, such as cigarettes and e-cigarettes. If you need help quitting, ask your doctor.  Do not wear high-heeled shoes.  Do not sleep on your belly.  If you are overweight, work with your doctor to lose weight safely.  To prevent or treat constipation while you are taking prescription pain medicine, your doctor may recommend that you: ? Drink enough fluid to keep your pee (urine) clear or pale yellow. ? Take over-the-counter or prescription medicines. ? Eat foods that are high in fiber. These include fresh fruits and vegetables, whole grains, and beans. ? Limit foods that are high in fat and processed sugars. These include fried and sweet foods.  Keep all follow-up visits as told by your doctor. This  is important. How is this prevented?  Stay at a healthy weight.  Try to avoid stress.  Stay in shape. Do at least 150 minutes of moderate-intensity exercise each week, such as fast walking or water aerobics.  When lifting objects: ? Keep your feet as far apart as your shoulders (shoulder-width apart) or farther apart. ? Tighten your belly muscles. ? Bend your knees and hips and keep your spine neutral. Lift using the strength of your legs, not your back. Do not lock your knees straight out. ? Always ask for help to lift heavy or awkward objects. Contact a doctor if:  You have back pain or neck pain that does not get better after 6 weeks.  You have very bad pain.  You get any of these problems in any part of your body: ? Tingling. ? Weakness. ? Loss of feeling (numbness). Get help right away if:  You cannot move your arms or legs.  You cannot control when you pee (urinate) or poop (have a bowel movement).  You feel dizzy.  You faint.  You have trouble breathing. This information is not intended to replace advice given to you by your health care provider. Make sure you discuss any questions you have with your health care provider. Document Released: 03/30/2014 Document Revised: 07/12/2016 Document Reviewed: 05/11/2016 Elsevier Interactive Patient Education  2017 Elsevier Inc.  

## 2017-11-09 NOTE — Progress Notes (Signed)
BP 128/84   Pulse 94   Temp 97.9 F (36.6 C) (Oral)   Ht 5\' 7"  (1.702 m)   Wt 181 lb 9.6 oz (82.4 kg)   BMI 28.44 kg/m    Subjective:    Patient ID: Kevin [name], male    DOB: 06/22/1986, 31 y.o.   MRN: [id number]  HPI: Kevin [name] is a 31 y.o. male presenting on 11/07/2017 for Follow-up (back pain- 4 week )  This patient comes in for 4-week recheck on his back pain.  He was unable to take the Neurontin 300 mg at bedtime because it caused him to over sleep.  He has to get up very early.  We discussed lowering it to 100 mg and have a slow titration.  We would like him to continue to try it due to the nerve symptoms that he is having.  He is also still on the Mobic daily.  He cannot tell a big difference with his back is still quite painful at times.  It is much worse after he has worked along stretch.  Relevant past medical, surgical, family and social history reviewed and updated as indicated. Allergies and medications reviewed and updated.  Past Medical History:  Diagnosis Date  . Arthritis   . GERD (gastroesophageal reflux disease)     Past Surgical History:  Procedure Laterality Date  . APPENDECTOMY    . HERNIA REPAIR Left    x 2 inguinal  . Wrist surgery       Review of Systems  Constitutional: Negative.  Negative for appetite change and fatigue.  Eyes: Negative for pain and visual disturbance.  Respiratory: Negative.  Negative for cough, chest tightness, shortness of breath and wheezing.   Cardiovascular: Negative.  Negative for chest pain, palpitations and leg swelling.  Gastrointestinal: Negative.  Negative for abdominal pain, diarrhea, nausea and vomiting.  Genitourinary: Negative.   Musculoskeletal: Positive for arthralgias, back pain and myalgias.  Skin: Negative.  Negative for color change and rash.  Neurological: Negative.  Negative for weakness, numbness and headaches.  Psychiatric/Behavioral: Negative.     Allergies as of 11/07/2017   No Known  Allergies     Medication List        Accurate as of 11/07/17 11:59 PM. Always use your most recent med list.          acetaminophen 500 MG tablet Commonly known as:  TYLENOL Take 1,000 mg by mouth every 8 (eight) hours as needed for mild pain. For pain   gabapentin 100 MG capsule Commonly known as:  NEURONTIN Take 1 capsule (100 mg total) by mouth at bedtime.   HYDROcodone-acetaminophen 10-325 MG tablet Commonly known as:  NORCO Take 1 tablet by mouth every 8 (eight) hours as needed.   meloxicam 15 MG tablet Commonly known as:  MOBIC Take 0.5-1 tablets (7.5-15 mg total) by mouth daily.          Objective:    BP 128/84   Pulse 94   Temp 97.9 F (36.6 C) (Oral)   Ht 5\' 7"  (1.702 m)   Wt 181 lb 9.6 oz (82.4 kg)   BMI 28.44 kg/m   No Known Allergies  Physical Exam  Constitutional: He appears well-developed and well-nourished. No distress.  HENT:  Head: Normocephalic and atraumatic.  Eyes: Conjunctivae and EOM are normal. Pupils are equal, round, and reactive to light.  Cardiovascular: Normal rate, regular rhythm and normal heart sounds.  Pulmonary/Chest: Effort normal and breath sounds normal. No  respiratory distress.  Musculoskeletal:       Lumbar back: He exhibits decreased range of motion, tenderness, pain and spasm.  Neurological: He has normal strength. He displays no atrophy and no tremor. No sensory deficit. He exhibits normal muscle tone. He displays no seizure activity.  Reflex Scores:      Patellar reflexes are 0 on the right side and 1+ on the left side. Skin: Skin is warm and dry.  Psychiatric: He has a normal mood and affect. His behavior is normal.  Nursing note and vitals reviewed.       Assessment & Plan:   1. Protrusion of lumbar intervertebral disc Plan x-ray and possible MRI - gabapentin (NEURONTIN) 100 MG capsule; Take 1 capsule (100 mg total) by mouth at bedtime.  Dispense: 90 capsule; Refill: 2 - meloxicam (MOBIC) 15 MG tablet; Take  0.5-1 tablets (7.5-15 mg total) by mouth daily.  Dispense: 30 tablet; Refill: 5 - HYDROcodone-acetaminophen (NORCO) 10-325 MG tablet; Take 1 tablet by mouth every 8 (eight) hours as needed.  Dispense: 60 tablet; Refill: 0    Current Outpatient Medications:  .  acetaminophen (TYLENOL) 500 MG tablet, Take 1,000 mg by mouth every 8 (eight) hours as needed for mild pain. For pain, Disp: , Rfl:  .  gabapentin (NEURONTIN) 100 MG capsule, Take 1 capsule (100 mg total) by mouth at bedtime., Disp: 90 capsule, Rfl: 2 .  HYDROcodone-acetaminophen (NORCO) 10-325 MG tablet, Take 1 tablet by mouth every 8 (eight) hours as needed., Disp: 60 tablet, Rfl: 0 .  meloxicam (MOBIC) 15 MG tablet, Take 0.5-1 tablets (7.5-15 mg total) by mouth daily., Disp: 30 tablet, Rfl: 5 Continue all other maintenance medications as listed above.  Follow up plan: Return in about 3 months (around 02/05/2018) for recheck.  Educational handout given for lumbar exercises  Remus LofflerAngel S. Dola Lunsford PA-C [facility] [address]  [place], [place] [address] [phone]   11/09/2017, 11:21 AM

## 2018-01-22 ENCOUNTER — Other Ambulatory Visit: Payer: Self-pay | Admitting: Physician Assistant

## 2018-01-22 DIAGNOSIS — M5126 Other intervertebral disc displacement, lumbar region: Secondary | ICD-10-CM

## 2018-02-08 ENCOUNTER — Encounter: Payer: Self-pay | Admitting: Physician Assistant

## 2018-02-08 ENCOUNTER — Ambulatory Visit: Payer: Self-pay | Admitting: Physician Assistant

## 2018-02-08 VITALS — BP 122/77 | HR 65 | Temp 97.9°F | Ht 67.0 in | Wt 181.6 lb

## 2018-02-08 DIAGNOSIS — M5136 Other intervertebral disc degeneration, lumbar region: Secondary | ICD-10-CM

## 2018-02-08 DIAGNOSIS — M5126 Other intervertebral disc displacement, lumbar region: Secondary | ICD-10-CM

## 2018-02-08 DIAGNOSIS — M5137 Other intervertebral disc degeneration, lumbosacral region: Secondary | ICD-10-CM

## 2018-02-08 MED ORDER — GABAPENTIN 100 MG PO CAPS: 100.0000 mg | ORAL_CAPSULE | Freq: Every day | ORAL | 3 refills | Status: DC

## 2018-02-08 MED ORDER — HYDROCODONE-ACETAMINOPHEN 10-325 MG PO TABS: 1.0000 | ORAL_TABLET | Freq: Three times a day (TID) | ORAL | 0 refills | Status: DC | PRN

## 2018-02-08 MED ORDER — MELOXICAM 15 MG PO TABS
7.5000 mg | ORAL_TABLET | Freq: Every day | ORAL | 3 refills | Status: DC
Start: 2018-02-08 — End: 2018-08-14

## 2018-02-08 NOTE — Patient Instructions (Signed)
In a few days you may receive a survey in the mail or online from [employer] regarding your visit with us today. Please take a moment to fill this out. Your feedback is very important to our whole office. It can help us better understand your needs as well as improve your experience and satisfaction. Thank you for taking your time to complete it. We care about you.  Kyre Jeffries, PA-C  

## 2018-02-10 NOTE — Progress Notes (Signed)
BP 122/77   Pulse 65   Temp 97.9 F (36.6 C) (Oral)   Ht 5\' 7"  (1.702 m)   Wt 181 lb 9.6 oz (82.4 kg)   BMI 28.44 kg/m    Subjective:    Patient ID: Kevin [name], Kevin [name]    DOB: 1986-01-17, 32 y.o.   MRN: [id number]  HPI: Kevin A Tartt is a 32 y.o. Kevin [name] presenting on 02/08/2018 for Follow-up (back )  This patient comes in for periodic recheck on medications and conditions including DDD and chronic back pain. Repots that he has been slightly better and no major setbacks at work.   All medications are reviewed today. There are no reports of any problems with the medications. All of the medical conditions are reviewed and updated.  Lab work is reviewed and will be ordered as medically necessary. There are no new problems reported with today's visit.   Past Medical History:  Diagnosis Date  . Arthritis   . GERD (gastroesophageal reflux disease)    Relevant past medical, surgical, family and social history reviewed and updated as indicated. Interim medical history since our last visit reviewed. Allergies and medications reviewed and updated. DATA REVIEWED: CHART IN EPIC  Family History reviewed for pertinent findings.  Review of Systems  Constitutional: Negative.  Negative for appetite change and fatigue.  Eyes: Negative for pain and visual disturbance.  Respiratory: Negative.  Negative for cough, chest tightness, shortness of breath and wheezing.   Cardiovascular: Negative.  Negative for chest pain, palpitations and leg swelling.  Gastrointestinal: Negative.  Negative for abdominal pain, diarrhea, nausea and vomiting.  Genitourinary: Negative.   Skin: Negative.  Negative for color change and rash.  Neurological: Negative.  Negative for weakness, numbness and headaches.  Psychiatric/Behavioral: Negative.     Allergies as of 02/08/2018   No Known Allergies     Medication List        Accurate as of 02/08/18 11:59 PM. Always use your most recent med list.            acetaminophen 500 MG tablet Commonly known as:  TYLENOL Take 1,000 mg by mouth every 8 (eight) hours as needed for mild pain. For pain   gabapentin 100 MG capsule Commonly known as:  NEURONTIN Take 1 capsule (100 mg total) by mouth at bedtime.   HYDROcodone-acetaminophen 10-325 MG tablet Commonly known as:  NORCO Take 1 tablet by mouth every 8 (eight) hours as needed.   meloxicam 15 MG tablet Commonly known as:  MOBIC Take 0.5-1 tablets (7.5-15 mg total) by mouth daily.          Objective:    BP 122/77   Pulse 65   Temp 97.9 F (36.6 C) (Oral)   Ht 5\' 7"  (1.702 m)   Wt 181 lb 9.6 oz (82.4 kg)   BMI 28.44 kg/m   No Known Allergies  Wt Readings from Last 3 Encounters:  02/08/18 181 lb 9.6 oz (82.4 kg)  11/07/17 181 lb 9.6 oz (82.4 kg)  10/10/17 179 lb (81.2 kg)    Physical Exam  Constitutional: He appears well-developed and well-nourished. No distress.  HENT:  Head: Normocephalic and atraumatic.  Eyes: Conjunctivae and EOM are normal. Pupils are equal, round, and reactive to light.  Cardiovascular: Normal rate, regular rhythm and normal heart sounds.  Pulmonary/Chest: Effort normal and breath sounds normal. No respiratory distress.  Skin: Skin is warm and dry.  Psychiatric: He has a normal mood and affect. His behavior is normal.  Nursing note and vitals reviewed.       Assessment & Plan:   1. Degenerative disc disease at L5-S1 level  2. Protrusion of lumbar intervertebral disc - HYDROcodone-acetaminophen (NORCO) 10-325 MG tablet; Take 1 tablet by mouth every 8 (eight) hours as needed.  Dispense: 60 tablet; Refill: 0 - gabapentin (NEURONTIN) 100 MG capsule; Take 1 capsule (100 mg total) by mouth at bedtime.  Dispense: 90 capsule; Refill: 3 - meloxicam (MOBIC) 15 MG tablet; Take 0.5-1 tablets (7.5-15 mg total) by mouth daily.  Dispense: 90 tablet; Refill: 3   Continue all other maintenance medications as listed above.  Follow up plan: Return in about 3  months (around 05/11/2018).  Educational handout given for survey  Remus LofflerAngel S. Blanca Carreon PA-C [facility] [address]  [place], [place] [address] [phone]   02/10/2018, 3:39 PM

## 2018-03-25 ENCOUNTER — Ambulatory Visit (INDEPENDENT_AMBULATORY_CARE_PROVIDER_SITE_OTHER): Payer: Self-pay | Admitting: Physician Assistant

## 2018-03-25 ENCOUNTER — Encounter: Payer: Self-pay | Admitting: Physician Assistant

## 2018-03-25 VITALS — BP 118/74 | HR 81 | Temp 97.8°F | Ht 67.0 in | Wt 182.0 lb

## 2018-03-25 DIAGNOSIS — J111 Influenza due to unidentified influenza virus with other respiratory manifestations: Secondary | ICD-10-CM

## 2018-03-25 MED ORDER — AZITHROMYCIN 250 MG PO TABS: ORAL_TABLET | ORAL | 0 refills | Status: DC

## 2018-03-25 MED ORDER — OSELTAMIVIR PHOSPHATE 75 MG PO CAPS: 75.0000 mg | ORAL_CAPSULE | Freq: Two times a day (BID) | ORAL | 0 refills | Status: DC

## 2018-03-25 NOTE — Progress Notes (Signed)
BP 118/74   Pulse 81   Temp 97.8 F (36.6 C) (Oral)   Ht  (1.702 m)   Wt 182 lb (82.6 kg)   BMI 28.51 kg/m    Subjective:    Patient ID: Kevin A Boeve, male    DOB: [birth year], 32 y.o.   MRN: [id number]  HPI: Kevin [name] is a 32 y.o. male presenting on 03/25/2018 for Diarrhea; chest congestion; Headache; and sinus pressure  This patient has had less than 2 days severe fever, chills, myalgias.  Complains of sinus headache and postnasal drainage. There is copious drainage at times. Associated sore throat, decreased appetite and headache.  Has been exposed to influenza.    Past Medical History:  Diagnosis Date  . Arthritis   . GERD (gastroesophageal reflux disease)    Relevant past medical, surgical, family and social history reviewed and updated as indicated. Interim medical history since our last visit reviewed. Allergies and medications reviewed and updated. DATA REVIEWED: CHART IN EPIC  Family History reviewed for pertinent findings.  Review of Systems  Constitutional: Positive for activity change, fatigue and fever. Negative for appetite change.  HENT: Positive for congestion and sore throat. Negative for sinus pressure.   Eyes: Negative.  Negative for pain and visual disturbance.  Respiratory: Negative for cough, chest tightness, shortness of breath and wheezing.   Cardiovascular: Negative.  Negative for chest pain, palpitations and leg swelling.  Gastrointestinal: Positive for nausea. Negative for abdominal pain, diarrhea and vomiting.  Endocrine: Negative.   Genitourinary: Negative.   Musculoskeletal: Positive for back pain and myalgias. Negative for arthralgias.  Skin: Negative.  Negative for color change and rash.  Neurological: Positive for headaches. Negative for weakness and numbness.  Psychiatric/Behavioral: Negative.     Allergies as of 03/25/2018   No Known Allergies     Medication List        Accurate as of 03/25/18 11:09 AM. Always use your most  recent med list.          acetaminophen 500 MG tablet Commonly known as:  TYLENOL Take 1,000 mg by mouth every 8 (eight) hours as needed for mild pain. For pain   azithromycin 250 MG tablet Commonly known as:  ZITHROMAX Z-PAK Take as directed   gabapentin 100 MG capsule Commonly known as:  NEURONTIN Take 1 capsule (100 mg total) by mouth at bedtime.   HYDROcodone-acetaminophen 10-325 MG tablet Commonly known as:  NORCO Take 1 tablet by mouth every 8 (eight) hours as needed.   meloxicam 15 MG tablet Commonly known as:  MOBIC Take 0.5-1 tablets (7.5-15 mg total) by mouth daily.   oseltamivir 75 MG capsule Commonly known as:  TAMIFLU Take 1 capsule (75 mg total) by mouth 2 (two) times daily.          Objective:    BP 118/74   Pulse 81   Temp 97.8 F (36.6 C) (Oral)   Ht  (1.702 m)   Wt 182 lb (82.6 kg)   BMI 28.51 kg/m   No Known Allergies  Wt Readings from Last 3 Encounters:  03/25/18 182 lb (82.6 kg)  02/08/18 181 lb 9.6 oz (82.4 kg)  11/07/17 181 lb 9.6 oz (82.4 kg)    Physical Exam  Constitutional: He is oriented to person, place, and time. He appears well-developed and well-nourished. He appears distressed.  HENT:  Head: Normocephalic and atraumatic.  Right Ear: Tympanic membrane normal. No drainage. No middle ear effusion.  Left Ear: Tympanic  membrane normal. No drainage.  No middle ear effusion.  Nose: Mucosal edema and rhinorrhea present. Right sinus exhibits no maxillary sinus tenderness. Left sinus exhibits no maxillary sinus tenderness.  Mouth/Throat: Uvula is midline. Posterior oropharyngeal erythema present. No oropharyngeal exudate.  Eyes: Pupils are equal, round, and reactive to light. Conjunctivae and EOM are normal. Right eye exhibits no discharge. Left eye exhibits no discharge.  Neck: Normal range of motion.  Cardiovascular: Normal rate, regular rhythm and normal heart sounds.  Pulmonary/Chest: Effort normal and breath sounds normal.  No respiratory distress. He has no wheezes.  Abdominal: Soft.  Lymphadenopathy:    He has no cervical adenopathy.  Neurological: He is alert and oriented to person, place, and time.  Skin: Skin is warm and dry.  Psychiatric: He has a normal mood and affect. His behavior is normal.  Nursing note and vitals reviewed.   Results for orders placed or performed during the hospital encounter of 02/23/14  Culture, Group A Strep  Result Value Ref Range   Specimen Description THROAT    Special Requests NONE    Culture      No Beta Hemolytic Streptococci Isolated Performed at [facility]   Report Status 02/25/2014 FINAL   POCT rapid strep A [facility])  Result Value Ref Range   Streptococcus, Group A Screen (Direct) NEGATIVE NEGATIVE      Assessment & Plan:   1. Influenza - oseltamivir (TAMIFLU) 75 MG capsule; Take 1 capsule (75 mg total) by mouth 2 (two) times daily.  Dispense: 10 capsule; Refill: 0   Continue all other maintenance medications as listed above.  Follow up plan: No follow-ups on file.  Educational handout given for survey  Remus Loffler PA-C [facility] [address]  [place], [place] [address] [phone]   03/25/2018, 11:09 AM

## 2018-08-14 ENCOUNTER — Ambulatory Visit (INDEPENDENT_AMBULATORY_CARE_PROVIDER_SITE_OTHER): Payer: Self-pay | Admitting: Family Medicine

## 2018-08-14 ENCOUNTER — Encounter: Payer: Self-pay | Admitting: Family Medicine

## 2018-08-14 VITALS — BP 117/80 | HR 76 | Temp 97.2°F | Ht 67.0 in | Wt 181.2 lb

## 2018-08-14 DIAGNOSIS — L237 Allergic contact dermatitis due to plants, except food: Secondary | ICD-10-CM

## 2018-08-14 DIAGNOSIS — M5126 Other intervertebral disc displacement, lumbar region: Secondary | ICD-10-CM

## 2018-08-14 MED ORDER — PREDNISONE 20 MG PO TABS: ORAL_TABLET | ORAL | 0 refills | Status: DC

## 2018-08-14 MED ORDER — HYDROCODONE-ACETAMINOPHEN 10-325 MG PO TABS: 1.0000 | ORAL_TABLET | Freq: Three times a day (TID) | ORAL | 0 refills | Status: DC | PRN

## 2018-08-14 NOTE — Progress Notes (Signed)
BP 117/80   Pulse 76   Temp (!) 97.2 F (36.2 C) (Oral)   Ht 5\' 7"  (1.702 m)   Wt 181 lb 3.2 oz (82.2 kg)   BMI 28.38 kg/m    Subjective:    Patient ID: Kevin [name], male    DOB: 06/02/1986, 32 y.o.   MRN: [id number]  HPI: Kevin [name] is a 32 y.o. male presenting on 08/14/2018 for Poison Oak (x 1 week- arms and neck) and Back Pain (Patient is requesting a refill on Norco )   HPI Rash Patient comes in complaining of rash that he thinks from poison ivy when he is working outside.  He says mostly its on his left forearm but he does have a couple spots on his right forearm and some on the side of his face and neck on that right side.  He denies any fevers or chills or redness or warmth or drainage.  He has not had severe reactions to poison ivy but has had a couple more recently, he does work outside so he is exposed.  Patient is coming in for back pain medication refill which he normally gets from his PCP Prudy FeelerAngel Jones.  This was discussed with her and she agrees that he is consistent and she does well and okay to go ahead and give him a one-month refill and he will follow-up with her.  Relevant past medical, surgical, family and social history reviewed and updated as indicated. Interim medical history since our last visit reviewed. Allergies and medications reviewed and updated.  Review of Systems  Constitutional: Negative for chills and fever.  Eyes: Negative for visual disturbance.  Respiratory: Negative for shortness of breath and wheezing.   Cardiovascular: Negative for chest pain and leg swelling.  Musculoskeletal: Negative for back pain and gait problem.  Skin: Positive for rash.  All other systems reviewed and are negative.   Per HPI unless specifically indicated above   Allergies as of 08/14/2018   No Known Allergies     Medication List        Accurate as of 08/14/18  2:59 PM. Always use your most recent med list.          acetaminophen 500 MG tablet Commonly  known as:  TYLENOL Take 1,000 mg by mouth every 8 (eight) hours as needed for mild pain. For pain   gabapentin 100 MG capsule Commonly known as:  NEURONTIN Take 1 capsule (100 mg total) by mouth at bedtime.   HYDROcodone-acetaminophen 10-325 MG tablet Commonly known as:  NORCO Take 1 tablet by mouth every 8 (eight) hours as needed.   predniSONE 20 MG tablet Commonly known as:  DELTASONE 2 po at same time daily for 5 days          Objective:    BP 117/80   Pulse 76   Temp (!) 97.2 F (36.2 C) (Oral)   Ht 5\' 7"  (1.702 m)   Wt 181 lb 3.2 oz (82.2 kg)   BMI 28.38 kg/m   Wt Readings from Last 3 Encounters:  08/14/18 181 lb 3.2 oz (82.2 kg)  03/25/18 182 lb (82.6 kg)  02/08/18 181 lb 9.6 oz (82.4 kg)    Physical Exam  Constitutional: He is oriented to person, place, and time. He appears well-developed and well-nourished. No distress.  Eyes: Conjunctivae are normal. No scleral icterus.  Musculoskeletal: Normal range of motion. He exhibits no edema.  Neurological: He is alert and oriented to person, place, and  time. Coordination normal.  Skin: Skin is warm and dry. Rash (Papules with crusting on left forearm and hand and a few spots on the right forearm and a few spots on the right side of his neck, consistent with contact dermatitis or poison oak or ivy dermatitis) noted. He is not diaphoretic.  Psychiatric: He has a normal mood and affect. His behavior is normal.  Nursing note and vitals reviewed.       Assessment & Plan:   Problem List Items Addressed This Visit      Musculoskeletal and Integument   Protrusion of lumbar intervertebral disc   Relevant Medications   HYDROcodone-acetaminophen (NORCO) 10-325 MG tablet    Other Visit Diagnoses    Poison ivy dermatitis    -  Primary   Relevant Medications   predniSONE (DELTASONE) 20 MG tablet       Follow up plan: Return if symptoms worsen or fail to improve.  Counseling provided for all of the vaccine  components No orders of the defined types were placed in this encounter.   Arville Care, MD [facility] 08/14/2018, 2:59 PM

## 2018-12-18 ENCOUNTER — Ambulatory Visit (INDEPENDENT_AMBULATORY_CARE_PROVIDER_SITE_OTHER): Payer: Self-pay | Admitting: Family Medicine

## 2018-12-18 ENCOUNTER — Encounter: Payer: Self-pay | Admitting: Family Medicine

## 2018-12-18 VITALS — BP 126/93 | HR 82 | Temp 97.7°F | Ht 67.0 in | Wt 185.0 lb

## 2018-12-18 DIAGNOSIS — J0110 Acute frontal sinusitis, unspecified: Secondary | ICD-10-CM

## 2018-12-18 MED ORDER — AZITHROMYCIN 250 MG PO TABS: ORAL_TABLET | ORAL | 0 refills | Status: DC

## 2018-12-18 NOTE — Progress Notes (Signed)
BP (!) 126/93   Pulse 82   Temp 97.7 F (36.5 C) (Oral)   Ht 5\' 7"  (1.702 m)   Wt 185 lb (83.9 kg)   SpO2 99%   BMI 28.98 kg/m    Subjective:    Patient ID: Kevin [name], male    DOB: Sep 30, 1986, 33 y.o.   MRN: [id number]  HPI: Kevin [name] is a 33 y.o. male presenting on 12/18/2018 for Headache (x 3 days); Wheezing; and Cough   HPI Cough and congestion and wheezing and headache Patient is coming in today with cough and congestion and wheezing headache this been going on for 3 days.  He says the coughing is been worse at night and the wheezing has been worse at night as well.  He has been using some over-the-counter Tylenol and ibuprofen to help with that but they have not been helping that significantly.  Patient denies any shortness of breath or fevers or chills but just has more a lot of sinus pressure and headache especially behind his eyes and a lot of drainage at night.  Patient does admit to still smoking cigarettes  Relevant past medical, surgical, family and social history reviewed and updated as indicated. Interim medical history since our last visit reviewed. Allergies and medications reviewed and updated.  Review of Systems  Constitutional: Negative for chills and fever.  HENT: Positive for congestion, postnasal drip, sinus pressure, sneezing and sore throat. Negative for ear discharge, ear pain, rhinorrhea and voice change.   Eyes: Negative for pain, discharge, redness and visual disturbance.  Respiratory: Positive for cough. Negative for shortness of breath and wheezing.   Cardiovascular: Negative for chest pain and leg swelling.  Musculoskeletal: Negative for gait problem.  Skin: Negative for rash.  All other systems reviewed and are negative.   Per HPI unless specifically indicated above   Allergies as of 12/18/2018   No Known Allergies     Medication List       Accurate as of December 18, 2018  2:42 PM. Always use your most recent med list.          acetaminophen 500 MG tablet Commonly known as:  TYLENOL Take 1,000 mg by mouth every 8 (eight) hours as needed for mild pain. For pain   azithromycin 250 MG tablet Commonly known as:  ZITHROMAX Take 2 the first day and then one each day after.   gabapentin 100 MG capsule Commonly known as:  NEURONTIN Take 1 capsule (100 mg total) by mouth at bedtime.   HYDROcodone-acetaminophen 10-325 MG tablet Commonly known as:  NORCO Take 1 tablet by mouth every 8 (eight) hours as needed.          Objective:    BP (!) 126/93   Pulse 82   Temp 97.7 F (36.5 C) (Oral)   Ht 5\' 7"  (1.702 m)   Wt 185 lb (83.9 kg)   SpO2 99%   BMI 28.98 kg/m   Wt Readings from Last 3 Encounters:  12/18/18 185 lb (83.9 kg)  08/14/18 181 lb 3.2 oz (82.2 kg)  03/25/18 182 lb (82.6 kg)    Physical Exam Vitals signs and nursing note reviewed.  Constitutional:      General: He is not in acute distress.    Appearance: He is well-developed. He is not diaphoretic.  HENT:     Right Ear: Tympanic membrane, ear canal and external ear normal.     Left Ear: Tympanic membrane, ear canal and external ear  normal.     Nose: Mucosal edema and rhinorrhea present.     Right Sinus: Frontal sinus tenderness present. No maxillary sinus tenderness.     Left Sinus: Frontal sinus tenderness present. No maxillary sinus tenderness.     Mouth/Throat:     Pharynx: Uvula midline. Posterior oropharyngeal erythema present. No oropharyngeal exudate.     Tonsils: No tonsillar abscesses.  Eyes:     General: No scleral icterus.       Right eye: No discharge.     Conjunctiva/sclera: Conjunctivae normal.  Neck:     Thyroid: No thyromegaly.  Cardiovascular:     Rate and Rhythm: Normal rate and regular rhythm.     Heart sounds: Normal heart sounds. No murmur.  Pulmonary:     Effort: Pulmonary effort is normal. No respiratory distress.     Breath sounds: Normal breath sounds. No wheezing or rales.  Musculoskeletal: Normal range of  motion.  Skin:    General: Skin is warm and dry.     Findings: No rash.  Neurological:     Mental Status: He is alert and oriented to person, place, and time.     Coordination: Coordination normal.  Psychiatric:        Behavior: Behavior normal.         Assessment & Plan:   Problem List Items Addressed This Visit    None    Visit Diagnoses    Acute non-recurrent frontal sinusitis    -  Primary   Relevant Medications   azithromycin (ZITHROMAX) 250 MG tablet       Follow up plan: Return if symptoms worsen or fail to improve.  Counseling provided for all of the vaccine components No orders of the defined types were placed in this encounter.   Arville CareJoshua Jodine Muchmore, MD [facility] 12/18/2018, 2:42 PM

## 2018-12-24 ENCOUNTER — Ambulatory Visit: Payer: Self-pay | Admitting: Physician Assistant

## 2019-01-29 ENCOUNTER — Encounter: Payer: Self-pay | Admitting: Physician Assistant

## 2019-01-29 ENCOUNTER — Ambulatory Visit: Payer: Self-pay | Admitting: Physician Assistant

## 2019-01-29 VITALS — BP 132/93 | HR 77 | Temp 98.3°F | Ht 67.0 in | Wt 181.4 lb

## 2019-01-29 DIAGNOSIS — H6501 Acute serous otitis media, right ear: Secondary | ICD-10-CM

## 2019-01-29 DIAGNOSIS — M5137 Other intervertebral disc degeneration, lumbosacral region: Secondary | ICD-10-CM

## 2019-01-29 DIAGNOSIS — M5136 Other intervertebral disc degeneration, lumbar region: Secondary | ICD-10-CM

## 2019-01-29 DIAGNOSIS — M5126 Other intervertebral disc displacement, lumbar region: Secondary | ICD-10-CM

## 2019-01-29 MED ORDER — AMOXICILLIN 500 MG PO CAPS: 500.0000 mg | ORAL_CAPSULE | Freq: Three times a day (TID) | ORAL | 0 refills | Status: DC

## 2019-01-29 MED ORDER — GABAPENTIN 100 MG PO CAPS: 100.0000 mg | ORAL_CAPSULE | Freq: Every day | ORAL | 3 refills | Status: DC

## 2019-01-29 MED ORDER — HYDROCODONE-ACETAMINOPHEN 10-325 MG PO TABS
1.0000 | ORAL_TABLET | Freq: Three times a day (TID) | ORAL | 0 refills | Status: DC | PRN
Start: 2019-01-29 — End: 2019-09-09

## 2019-01-30 DIAGNOSIS — H6501 Acute serous otitis media, right ear: Secondary | ICD-10-CM | POA: Insufficient documentation

## 2019-01-30 NOTE — Progress Notes (Signed)
BP (!) 132/93   Pulse 77   Temp 98.3 F (36.8 C) (Oral)   Ht 5\' 7"  (1.702 m)   Wt 181 lb 6.4 oz (82.3 kg)   BMI 28.41 kg/m    Subjective:    Patient ID: Kevin [name], male    DOB: [birth year], 33 y.o.   MRN: [id number]  HPI: Kevin [name] is a 33 y.o. male presenting on 01/29/2019 for Ear Pain (right )  This patient has had many days of sore throat and postnasal drainage, headache at times and sinus pressure. There is copious drainage at times. Denies any fever at this time. There has been a history of sinus infections in the past.  There is cough at night. It has become more prevalent in recent days.  He also is here for a recheck on his chronic back pain, he has taken 60 tabs in the past 6 months.  He is on one gabapentin, we discussed him increasing it in the coming months in hope to hardly ever use hydrocodone  Past Medical History:  Diagnosis Date  . Arthritis   . GERD (gastroesophageal reflux disease)    Relevant past medical, surgical, family and social history reviewed and updated as indicated. Interim medical history since our last visit reviewed. Allergies and medications reviewed and updated. DATA REVIEWED: CHART IN EPIC  Family History reviewed for pertinent findings.  Review of Systems  Constitutional: Positive for fatigue. Negative for appetite change and fever.  HENT: Positive for ear pain, sinus pressure and sore throat.   Eyes: Negative.  Negative for pain and visual disturbance.  Respiratory: Negative for cough, chest tightness, shortness of breath and wheezing.   Cardiovascular: Negative.  Negative for chest pain, palpitations and leg swelling.  Gastrointestinal: Negative.  Negative for abdominal pain, diarrhea, nausea and vomiting.  Endocrine: Negative.   Genitourinary: Negative.   Musculoskeletal: Positive for back pain and myalgias.  Skin: Negative.  Negative for color change and rash.  Neurological: Negative for weakness, numbness and headaches.    Psychiatric/Behavioral: Negative.     Allergies as of 01/29/2019   No Known Allergies     Medication List       Accurate as of January 29, 2019 11:59 PM. Always use your most recent med list.        acetaminophen 500 MG tablet Commonly known as:  TYLENOL Take 1,000 mg by mouth every 8 (eight) hours as needed for mild pain. For pain   amoxicillin 500 MG capsule Commonly known as:  AMOXIL Take 1 capsule (500 mg total) by mouth 3 (three) times daily.   gabapentin 100 MG capsule Commonly known as:  NEURONTIN Take 1-3 capsules (100-300 mg total) by mouth at bedtime.   HYDROcodone-acetaminophen 10-325 MG tablet Commonly known as:  NORCO Take 1 tablet by mouth every 8 (eight) hours as needed.          Objective:    BP (!) 132/93   Pulse 77   Temp 98.3 F (36.8 C) (Oral)   Ht 5\' 7"  (1.702 m)   Wt 181 lb 6.4 oz (82.3 kg)   BMI 28.41 kg/m   No Known Allergies  Wt Readings from Last 3 Encounters:  01/29/19 181 lb 6.4 oz (82.3 kg)  12/18/18 185 lb (83.9 kg)  08/14/18 181 lb 3.2 oz (82.2 kg)    Physical Exam Constitutional:      Appearance: He is well-developed.  HENT:     Head: Normocephalic and atraumatic.  Right Ear: External ear normal. A middle ear effusion is present. Tympanic membrane is erythematous.     Left Ear: External ear normal. A middle ear effusion is present.     Nose: Mucosal edema and rhinorrhea present.     Right Sinus: No maxillary sinus tenderness.     Left Sinus: No maxillary sinus tenderness.     Mouth/Throat:     Pharynx: Uvula midline. Posterior oropharyngeal erythema present.  Eyes:     General:        Right eye: No discharge.        Left eye: No discharge.     Conjunctiva/sclera: Conjunctivae normal.     Pupils: Pupils are equal, round, and reactive to light.  Neck:     Musculoskeletal: Normal range of motion.  Cardiovascular:     Rate and Rhythm: Normal rate and regular rhythm.     Heart sounds: Normal heart sounds.  Pulmonary:      Effort: Pulmonary effort is normal. No respiratory distress.     Breath sounds: Normal breath sounds. No wheezing.  Abdominal:     Palpations: Abdomen is soft.  Lymphadenopathy:     Cervical: No cervical adenopathy.  Skin:    General: Skin is warm and dry.  Neurological:     Mental Status: He is alert and oriented to person, place, and time.         Assessment & Plan:   1. Protrusion of lumbar intervertebral disc - gabapentin (NEURONTIN) 100 MG capsule; Take 1-3 capsules (100-300 mg total) by mouth at bedtime.  Dispense: 90 capsule; Refill: 3 - HYDROcodone-acetaminophen (NORCO) 10-325 MG tablet; Take 1 tablet by mouth every 8 (eight) hours as needed.  Dispense: 60 tablet; Refill: 0  2. Degenerative disc disease at L5-S1 level - gabapentin (NEURONTIN) 100 MG capsule; Take 1-3 capsules (100-300 mg total) by mouth at bedtime.  Dispense: 90 capsule; Refill: 3 - HYDROcodone-acetaminophen (NORCO) 10-325 MG tablet; Take 1 tablet by mouth every 8 (eight) hours as needed.  Dispense: 60 tablet; Refill: 0  3. Non-recurrent acute serous otitis media of right ear - amoxicillin (AMOXIL) 500 MG capsule; Take 1 capsule (500 mg total) by mouth 3 (three) times daily.  Dispense: 30 capsule; Refill: 0   Continue all other maintenance medications as listed above.  Follow up plan: No follow-ups on file.  Educational handout given for survey  Remus Loffler PA-C [facility] [address]  [place], [place] [address] [phone]   01/30/2019, 8:53 PM

## 2019-09-09 ENCOUNTER — Other Ambulatory Visit: Payer: Self-pay

## 2019-09-09 ENCOUNTER — Ambulatory Visit (INDEPENDENT_AMBULATORY_CARE_PROVIDER_SITE_OTHER): Payer: Self-pay | Admitting: Physician Assistant

## 2019-09-09 ENCOUNTER — Encounter: Payer: Self-pay | Admitting: Physician Assistant

## 2019-09-09 DIAGNOSIS — M5137 Other intervertebral disc degeneration, lumbosacral region: Secondary | ICD-10-CM

## 2019-09-09 DIAGNOSIS — M5136 Other intervertebral disc degeneration, lumbar region: Secondary | ICD-10-CM

## 2019-09-09 DIAGNOSIS — M5126 Other intervertebral disc displacement, lumbar region: Secondary | ICD-10-CM

## 2019-09-09 MED ORDER — HYDROCODONE-ACETAMINOPHEN 10-325 MG PO TABS: 1.0000 | ORAL_TABLET | Freq: Three times a day (TID) | ORAL | 0 refills | Status: DC | PRN

## 2019-09-09 MED ORDER — CYCLOBENZAPRINE HCL 10 MG PO TABS: 10.0000 mg | ORAL_TABLET | Freq: Three times a day (TID) | ORAL | 0 refills | Status: DC | PRN

## 2019-09-09 MED ORDER — PREDNISONE 10 MG (21) PO TBPK: ORAL_TABLET | ORAL | 0 refills | Status: DC

## 2019-09-09 NOTE — Patient Instructions (Signed)

## 2019-09-11 NOTE — Progress Notes (Signed)
BP 123/89    Pulse 80    Temp 99.3 F (37.4 C) (Temporal)    Ht 5\' 7"  (1.702 m)    Wt 175 lb (79.4 kg)    SpO2 99%    BMI 27.41 kg/m    Subjective:    Patient ID: Kevin [name], male    DOB: [birth year], 33 y.o.   MRN: [id number]  HPI: Kevin A Turay is a 33 y.o. male presenting on 09/09/2019 for Neck Pain  The past week the patient has had increasing pain in his neck.  He went to the chiropractor for an adjustment.  The pain is still in the neck and over into the top of the left shoulder.  He was working overhead at work and after about 4 hours it began to have pain.  He does have known degenerative disc disease.  He tried Advil on a regular basis without provement.  Also he did have a TENS and ice and manipulation at the chiropractor.  Past Medical History:  Diagnosis Date   Arthritis    GERD (gastroesophageal reflux disease)    Relevant past medical, surgical, family and social history reviewed and updated as indicated. Interim medical history since our last visit reviewed. Allergies and medications reviewed and updated. DATA REVIEWED: CHART IN EPIC  Family History reviewed for pertinent findings.  Review of Systems  Constitutional: Negative.  Negative for appetite change and fatigue.  HENT: Negative.   Eyes: Negative.  Negative for pain and visual disturbance.  Respiratory: Negative.  Negative for cough, chest tightness, shortness of breath and wheezing.   Cardiovascular: Negative.  Negative for chest pain, palpitations and leg swelling.  Gastrointestinal: Negative for vomiting.  Endocrine: Negative.   Genitourinary: Negative.   Musculoskeletal: Positive for arthralgias, back pain and neck pain.  Skin: Negative.  Negative for color change and rash.  Neurological: Negative.  Negative for weakness, numbness and headaches.  Psychiatric/Behavioral: Negative.     Allergies as of 09/09/2019   No Known Allergies     Medication List       Accurate as of September 09, 2019 11:59  PM. If you have any questions, ask your nurse or doctor.        STOP taking these medications   amoxicillin 500 MG capsule Commonly known as: AMOXIL Stopped by: Terald Sleeper, PA-C     TAKE these medications   acetaminophen 500 MG tablet Commonly known as: TYLENOL Take 1,000 mg by mouth every 8 (eight) hours as needed for mild pain. For pain   cyclobenzaprine 10 MG tablet Commonly known as: FLEXERIL Take 1 tablet (10 mg total) by mouth 3 (three) times daily as needed for muscle spasms. Started by: Terald Sleeper, PA-C   gabapentin 100 MG capsule Commonly known as: NEURONTIN Take 1-3 capsules (100-300 mg total) by mouth at bedtime.   HYDROcodone-acetaminophen 10-325 MG tablet Commonly known as: NORCO Take 1 tablet by mouth every 8 (eight) hours as needed.   predniSONE 10 MG (21) Tbpk tablet Commonly known as: STERAPRED UNI-PAK 21 TAB As directed x 6 days Started by: Terald Sleeper, PA-C          Objective:    BP 123/89    Pulse 80    Temp 99.3 F (37.4 C) (Temporal)    Ht 5\' 7"  (1.702 m)    Wt 175 lb (79.4 kg)    SpO2 99%    BMI 27.41 kg/m   No Known Allergies  Wt Readings  from Last 3 Encounters:  09/09/19 175 lb (79.4 kg)  01/29/19 181 lb 6.4 oz (82.3 kg)  12/18/18 185 lb (83.9 kg)    Physical Exam Vitals signs and nursing note reviewed.  Constitutional:      General: He is not in acute distress.    Appearance: He is well-developed.  HENT:     Head: Normocephalic and atraumatic.  Eyes:     Conjunctiva/sclera: Conjunctivae normal.     Pupils: Pupils are equal, round, and reactive to light.  Cardiovascular:     Rate and Rhythm: Normal rate and regular rhythm.     Heart sounds: Normal heart sounds.  Pulmonary:     Effort: Pulmonary effort is normal. No respiratory distress.     Breath sounds: Normal breath sounds.  Musculoskeletal:     Cervical back: He exhibits decreased range of motion, tenderness, pain and spasm. He exhibits no edema and no deformity.    Skin:    General: Skin is warm and dry.  Psychiatric:        Behavior: Behavior normal.     Results for orders placed or performed during the hospital encounter of 02/23/14  Culture, Group A Strep   Specimen: Throat  Result Value Ref Range   Specimen Description THROAT    Special Requests NONE    Culture      No Beta Hemolytic Streptococci Isolated Performed at [facility]   Report Status 02/25/2014 FINAL   POCT rapid strep A [facility])  Result Value Ref Range   Streptococcus, Group A Screen (Direct) NEGATIVE NEGATIVE      Assessment & Plan:   1. Protrusion of lumbar intervertebral disc - predniSONE (STERAPRED UNI-PAK 21 TAB) 10 MG (21) TBPK tablet; As directed x 6 days  Dispense: 21 tablet; Refill: 0 - HYDROcodone-acetaminophen (NORCO) 10-325 MG tablet; Take 1 tablet by mouth every 8 (eight) hours as needed.  Dispense: 60 tablet; Refill: 0 - cyclobenzaprine (FLEXERIL) 10 MG tablet; Take 1 tablet (10 mg total) by mouth 3 (three) times daily as needed for muscle spasms.  Dispense: 30 tablet; Refill: 0  2. Degenerative disc disease at L5-S1 level - predniSONE (STERAPRED UNI-PAK 21 TAB) 10 MG (21) TBPK tablet; As directed x 6 days  Dispense: 21 tablet; Refill: 0 - HYDROcodone-acetaminophen (NORCO) 10-325 MG tablet; Take 1 tablet by mouth every 8 (eight) hours as needed.  Dispense: 60 tablet; Refill: 0 - cyclobenzaprine (FLEXERIL) 10 MG tablet; Take 1 tablet (10 mg total) by mouth 3 (three) times daily as needed for muscle spasms.  Dispense: 30 tablet; Refill: 0   Continue all other maintenance medications as listed above.  Follow up plan: No follow-ups on file.  Educational handout given for neck exercises  Remus Loffler PA-C [facility] [address]  [place], [place] [address] [phone]   09/11/2019, 8:49 PM

## 2019-11-10 ENCOUNTER — Ambulatory Visit (INDEPENDENT_AMBULATORY_CARE_PROVIDER_SITE_OTHER): Payer: Self-pay | Admitting: Family Medicine

## 2019-11-10 ENCOUNTER — Other Ambulatory Visit: Payer: Self-pay

## 2019-11-10 ENCOUNTER — Encounter: Payer: Self-pay | Admitting: Family Medicine

## 2019-11-10 VITALS — BP 110/62 | HR 84 | Temp 98.2°F | Resp 20 | Ht 67.0 in | Wt 172.0 lb

## 2019-11-10 DIAGNOSIS — L03114 Cellulitis of left upper limb: Secondary | ICD-10-CM

## 2019-11-10 DIAGNOSIS — M25512 Pain in left shoulder: Secondary | ICD-10-CM

## 2019-11-10 DIAGNOSIS — M62838 Other muscle spasm: Secondary | ICD-10-CM

## 2019-11-10 MED ORDER — SULFAMETHOXAZOLE-TRIMETHOPRIM 800-160 MG PO TABS: 1.0000 | ORAL_TABLET | Freq: Two times a day (BID) | ORAL | 0 refills | Status: AC

## 2019-11-10 MED ORDER — PREDNISONE 20 MG PO TABS: ORAL_TABLET | ORAL | 0 refills | Status: DC

## 2019-11-10 MED ORDER — BACLOFEN 10 MG PO TABS: 10.0000 mg | ORAL_TABLET | Freq: Three times a day (TID) | ORAL | 0 refills | Status: DC

## 2019-11-10 NOTE — Patient Instructions (Signed)
Muscle Cramps and Spasms Muscle cramps and spasms are when muscles tighten by themselves. They usually get better within minutes. Muscle cramps are painful. They are usually stronger and last longer than muscle spasms. Muscle spasms may or may not be painful. They can last a few seconds or much longer. Cramps and spasms can affect any muscle, but they occur most often in the calf muscles of the leg. They are usually not caused by a serious problem. In many cases, the cause is not known. Some common causes include:  Doing more physical work or exercise than your body is ready for.  Using the muscles too much (overuse) by repeating certain movements too many times.  Staying in a certain position for a long time.  Playing a sport or doing an activity without preparing properly.  Using bad form or technique while playing a sport or doing an activity.  Not having enough water in your body (dehydration).  Injury.  Side effects of some medicines.  Low levels of the salts and minerals in your blood (electrolytes), such as low potassium or calcium. Follow these instructions at home: Managing pain and stiffness      Massage, stretch, and relax the muscle. Do this for many minutes at a time.  If told, put heat on tight or tense muscles as often as told by your doctor. Use the heat source that your doctor recommends, such as a moist heat pack or a heating pad. ? Place a towel between your skin and the heat source. ? Leave the heat on for 20-30 minutes. ? Remove the heat if your skin turns bright red. This is very important if you are not able to feel pain, heat, or cold. You may have a greater risk of getting burned.  If told, put ice on the affected area. This may help if you are sore or have pain after a cramp or spasm. ? Put ice in a plastic bag. ? Place a towel between your skin and the bag. ? Leave the ice on for 20 minutes, 2-3 times a day.  Try taking hot showers or baths to help  relax tight muscles. Eating and drinking  Drink enough fluid to keep your pee (urine) pale yellow.  Eat a healthy diet to help ensure that your muscles work well. This should include: ? Fruits and vegetables. ? Lean protein. ? Whole grains. ? Low-fat or nonfat dairy products. General instructions  If you are having cramps often, avoid intense exercise for several days.  Take over-the-counter and prescription medicines only as told by your doctor.  Watch for any changes in your symptoms.  Keep all follow-up visits as told by your doctor. This is important. Contact a doctor if:  Your cramps or spasms get worse or happen more often.  Your cramps or spasms do not get better with time. Summary  Muscle cramps and spasms are when muscles tighten by themselves. They usually get better within minutes.  Cramps and spasms occur most often in the calf muscles of the leg.  Massage, stretch, and relax the muscle. This may help the cramp or spasm go away.  Drink enough fluid to keep your pee (urine) pale yellow. This information is not intended to replace advice given to you by your health care provider. Make sure you discuss any questions you have with your health care provider. Document Released: 10/26/2008 Document Revised: 04/08/2018 Document Reviewed: 04/08/2018 Elsevier Patient Education  2020 [employer]  

## 2019-11-10 NOTE — Progress Notes (Signed)
Subjective:  Patient ID: Kevin [name], male    DOB: [birth year], 33 y.o.   MRN: [id number]  Patient Care Team: Caryl Never as PCP - General (Physician Assistant)   Chief Complaint:  left shoulder pain (on-going) and left hand pain (red/ swollen)   HPI: Kevin [name] is a 33 y.o. male presenting on 11/10/2019 for left shoulder pain (on-going) and left hand pain (red/ swollen)   Pt reports a wound to his left hand. States he got scratched by a briar on Saturday and now he has pain and swelling around the site. No fever or chills.   Shoulder Pain  The pain is present in the neck, left shoulder and back. This is a recurrent problem. The current episode started 1 to 4 weeks ago. There has been no history of extremity trauma. The problem occurs constantly. The problem has been waxing and waning. The quality of the pain is described as aching and sharp. The pain is at a severity of 5/10. The pain is moderate. Associated symptoms include stiffness. Pertinent negatives include no fever, inability to bear weight, itching, joint locking, joint swelling, limited range of motion, numbness or tingling. The symptoms are aggravated by activity. He has tried NSAIDS and cold (muscle relaxer) for the symptoms. The treatment provided mild relief.    Relevant past medical, surgical, family, and social history reviewed and updated as indicated.  Allergies and medications reviewed and updated. Date reviewed: Chart in Epic.   Past Medical History:  Diagnosis Date  . Arthritis   . GERD (gastroesophageal reflux disease)     Past Surgical History:  Procedure Laterality Date  . APPENDECTOMY    . HERNIA REPAIR Left    x 2 inguinal  . Wrist surgery       Social History   Socioeconomic History  . Marital status: Married    Spouse name: Not on file  . Number of children: Not on file  . Years of education: Not on file  . Highest education level: Not on file  Occupational History  . Not on  file  Tobacco Use  . Smoking status: Current Every Day Smoker    Packs/day: 1.00    Years: 8.00    Pack years: 8.00    Types: Cigarettes  . Smokeless tobacco: Current User  Substance and Sexual Activity  . Alcohol use: No  . Drug use: No    Types: Marijuana  . Sexual activity: Yes  Other Topics Concern  . Not on file  Social History Narrative  . Not on file   Social Determinants of Health   Financial Resource Strain:   . Difficulty of Paying Living Expenses: Not on file  Food Insecurity:   . Worried About Programme researcher, broadcasting/film/video in the Last Year: Not on file  . Ran Out of Food in the Last Year: Not on file  Transportation Needs:   . Lack of Transportation (Medical): Not on file  . Lack of Transportation (Non-Medical): Not on file  Physical Activity:   . Days of Exercise per Week: Not on file  . Minutes of Exercise per Session: Not on file  Stress:   . Feeling of Stress : Not on file  Social Connections:   . Frequency of Communication with Friends and Family: Not on file  . Frequency of Social Gatherings with Friends and Family: Not on file  . Attends Religious Services: Not on file  . Active Member of Clubs or  Organizations: Not on file  . Attends Archivist Meetings: Not on file  . Marital Status: Not on file  Intimate Partner Violence:   . Fear of Current or Ex-Partner: Not on file  . Emotionally Abused: Not on file  . Physically Abused: Not on file  . Sexually Abused: Not on file    Outpatient Encounter Medications as of 11/10/2019  Medication Sig  . gabapentin (NEURONTIN) 100 MG capsule Take 1-3 capsules (100-300 mg total) by mouth at bedtime.  Marland Kitchen ibuprofen (ADVIL) 600 MG tablet Take 600 mg by mouth every 6 (six) hours as needed.  . baclofen (LIORESAL) 10 MG tablet Take 1 tablet (10 mg total) by mouth 3 (three) times daily.  . predniSONE (DELTASONE) 20 MG tablet 2 po at sametime daily for 5 days  . sulfamethoxazole-trimethoprim (BACTRIM DS) 800-160 MG  tablet Take 1 tablet by mouth 2 (two) times daily for 7 days.  . [DISCONTINUED] acetaminophen (TYLENOL) 500 MG tablet Take 1,000 mg by mouth every 8 (eight) hours as needed for mild pain. For pain  . [DISCONTINUED] cyclobenzaprine (FLEXERIL) 10 MG tablet Take 1 tablet (10 mg total) by mouth 3 (three) times daily as needed for muscle spasms. (Patient not taking: Reported on 11/10/2019)  . [DISCONTINUED] HYDROcodone-acetaminophen (NORCO) 10-325 MG tablet Take 1 tablet by mouth every 8 (eight) hours as needed. (Patient not taking: Reported on 11/10/2019)  . [DISCONTINUED] predniSONE (STERAPRED UNI-PAK 21 TAB) 10 MG (21) TBPK tablet As directed x 6 days   No facility-administered encounter medications on file as of 11/10/2019.    No Known Allergies  Review of Systems  Constitutional: Negative for activity change, appetite change, chills, diaphoresis, fatigue, fever and unexpected weight change.  HENT: Negative.   Eyes: Negative.  Negative for photophobia and visual disturbance.  Respiratory: Negative for cough, chest tightness and shortness of breath.   Cardiovascular: Negative for chest pain, palpitations and leg swelling.  Gastrointestinal: Negative for abdominal pain, blood in stool, constipation, diarrhea, nausea and vomiting.  Endocrine: Negative.   Genitourinary: Negative for decreased urine volume, difficulty urinating, dysuria, frequency and urgency.  Musculoskeletal: Positive for arthralgias, myalgias and stiffness. Negative for back pain, gait problem, neck pain and neck stiffness.  Skin: Positive for color change and wound. Negative for itching.  Allergic/Immunologic: Negative.   Neurological: Negative for dizziness, tingling, weakness, numbness and headaches.  Hematological: Negative.   Psychiatric/Behavioral: Negative for confusion, hallucinations, sleep disturbance and suicidal ideas.  All other systems reviewed and are negative.       Objective:  BP 110/62   Pulse 84    Temp 98.2 F (36.8 C)   Resp 20   Ht 5\' 7"  (1.702 m)   Wt 172 lb (78 kg)   SpO2 97%   BMI 26.94 kg/m    Wt Readings from Last 3 Encounters:  11/10/19 172 lb (78 kg)  09/09/19 175 lb (79.4 kg)  01/29/19 181 lb 6.4 oz (82.3 kg)    Physical Exam Vitals and nursing note reviewed.  Constitutional:      General: He is not in acute distress.    Appearance: Normal appearance. He is well-developed and well-groomed. He is not ill-appearing, toxic-appearing or diaphoretic.  HENT:     Head: Normocephalic and atraumatic.     Jaw: There is normal jaw occlusion.     Right Ear: Hearing normal.     Left Ear: Hearing normal.     Nose: Nose normal.     Mouth/Throat:  Lips: Pink.     Mouth: Mucous membranes are moist.     Pharynx: Oropharynx is clear. Uvula midline.  Eyes:     General: Lids are normal.     Extraocular Movements: Extraocular movements intact.     Conjunctiva/sclera: Conjunctivae normal.     Pupils: Pupils are equal, round, and reactive to light.  Neck:     Thyroid: No thyroid mass, thyromegaly or thyroid tenderness.     Vascular: No carotid bruit or JVD.     Trachea: Trachea and phonation normal.  Cardiovascular:     Rate and Rhythm: Normal rate and regular rhythm.     Chest Wall: PMI is not displaced.     Pulses: Normal pulses.     Heart sounds: Normal heart sounds. No murmur. No friction rub. No gallop.   Pulmonary:     Effort: Pulmonary effort is normal. No respiratory distress.     Breath sounds: Normal breath sounds. No wheezing.  Abdominal:     General: Bowel sounds are normal. There is no distension or abdominal bruit.     Palpations: Abdomen is soft. There is no hepatomegaly or splenomegaly.     Tenderness: There is no abdominal tenderness. There is no right CVA tenderness or left CVA tenderness.     Hernia: No hernia is present.  Musculoskeletal:        General: Normal range of motion.     Right shoulder: Normal.     Left shoulder: Tenderness present. No  swelling, deformity, effusion, laceration, bony tenderness or crepitus. Normal range of motion. Normal strength. Normal pulse.     Right upper arm: Normal.     Left upper arm: Normal.       Arms:     Cervical back: Normal range of motion and neck supple. Spasms and tenderness present. No swelling, edema, deformity, erythema, signs of trauma, lacerations, rigidity, torticollis, bony tenderness or crepitus. No pain with movement. Normal range of motion.     Thoracic back: Normal.     Lumbar back: Normal.     Right lower leg: No edema.     Left lower leg: No edema.  Lymphadenopathy:     Cervical: No cervical adenopathy.  Skin:    General: Skin is warm and dry.     Capillary Refill: Capillary refill takes less than 2 seconds.     Coloration: Skin is not cyanotic, jaundiced or pale.     Findings: Erythema and wound present. No rash.       Neurological:     General: No focal deficit present.     Mental Status: He is alert and oriented to person, place, and time.     Cranial Nerves: Cranial nerves are intact. No cranial nerve deficit.     Sensory: Sensation is intact. No sensory deficit.     Motor: Motor function is intact. No weakness.     Coordination: Coordination is intact. Coordination normal.     Gait: Gait is intact. Gait normal.     Deep Tendon Reflexes: Reflexes are normal and symmetric. Reflexes normal.  Psychiatric:        Attention and Perception: Attention and perception normal.        Mood and Affect: Mood and affect normal.        Speech: Speech normal.        Behavior: Behavior normal. Behavior is cooperative.        Thought Content: Thought content normal.  Cognition and Memory: Cognition and memory normal.        Judgment: Judgment normal.     Results for orders placed or performed during the hospital encounter of 02/23/14  Culture, Group A Strep   Specimen: Throat  Result Value Ref Range   Specimen Description THROAT    Special Requests NONE    Culture       No Beta Hemolytic Streptococci Isolated Performed at [facility]   Report Status 02/25/2014 FINAL   POCT rapid strep A [facility])  Result Value Ref Range   Streptococcus, Group A Screen (Direct) NEGATIVE NEGATIVE       Pertinent labs & imaging results that were available during my care of the patient were reviewed by me and considered in my medical decision making.  Assessment & Plan:  Kevin was seen today for left shoulder pain and left hand pain.  Diagnoses and all orders for this visit:  Acute pain of left shoulder Trapezius muscle spasm Spasm and tenderness to left trapezius. Symptomatic care discussed in detail. Medications as prescribed. Pt declined PT. Pt aware to report any new, worsening, or persistent symptoms.  -     predniSONE (DELTASONE) 20 MG tablet; 2 po at sametime daily for 5 days -     baclofen (LIORESAL) 10 MG tablet; Take 1 tablet (10 mg total) by mouth 3 (three) times daily.  Cellulitis of left hand Redness and swelling to left dorsal hand at the base of the fourth digit post injury from a briar. Will initiate below. Pt aware to report any new or worsening symptoms.  -     sulfamethoxazole-trimethoprim (BACTRIM DS) 800-160 MG tablet; Take 1 tablet by mouth 2 (two) times daily for 7 days.     Continue all other maintenance medications.  Follow up plan: Return if symptoms worsen or fail to improve.  Continue healthy lifestyle choices, including diet (rich in fruits, vegetables, and lean proteins, and low in salt and simple carbohydrates) and exercise (at least 30 minutes of moderate physical activity daily).  Educational handout given for muscle cramps and spasms  The above assessment and management plan was discussed with the patient. The patient verbalized understanding of and has agreed to the management plan. Patient is aware to call the clinic if they develop any new symptoms or if symptoms persist or worsen. Patient is aware when to  return to the clinic for a follow-up visit. Patient educated on when it is appropriate to go to the emergency department.   Kari Baars, FNP-C [facility] [phone]

## 2020-02-09 ENCOUNTER — Other Ambulatory Visit: Payer: Self-pay

## 2020-02-10 ENCOUNTER — Encounter: Payer: Self-pay | Admitting: Family Medicine

## 2020-02-10 ENCOUNTER — Ambulatory Visit (INDEPENDENT_AMBULATORY_CARE_PROVIDER_SITE_OTHER): Payer: Self-pay | Admitting: Family Medicine

## 2020-02-10 ENCOUNTER — Ambulatory Visit (INDEPENDENT_AMBULATORY_CARE_PROVIDER_SITE_OTHER): Payer: Self-pay

## 2020-02-10 ENCOUNTER — Other Ambulatory Visit: Payer: Self-pay

## 2020-02-10 VITALS — BP 116/79 | HR 75 | Temp 99.0°F | Resp 20 | Ht 67.0 in | Wt 171.0 lb

## 2020-02-10 DIAGNOSIS — M791 Myalgia, unspecified site: Secondary | ICD-10-CM

## 2020-02-10 DIAGNOSIS — M25571 Pain in right ankle and joints of right foot: Secondary | ICD-10-CM

## 2020-02-10 MED ORDER — PREDNISONE 20 MG PO TABS: ORAL_TABLET | ORAL | 0 refills | Status: DC

## 2020-02-10 NOTE — Patient Instructions (Signed)
Ankle Pain The ankle joint holds your body weight and allows you to move around. Ankle pain can occur on either side or the back of one ankle or both ankles. Ankle pain may be sharp and burning or dull and aching. There may be tenderness, stiffness, redness, or warmth around the ankle. Many things can cause ankle pain, including an injury to the area and overuse of the ankle. Follow these instructions at home: Activity  Rest your ankle as told by your health care provider. Avoid any activities that cause ankle pain.  Do not use the injured limb to support your body weight until your health care provider says that you can. Use crutches as told by your health care provider.  Do exercises as told by your health care provider.  Ask your health care provider when it is safe to drive if you have a brace on your ankle. If you have a brace:  Wear the brace as told by your health care provider. Remove it only as told by your health care provider.  Loosen the brace if your toes tingle, become numb, or turn cold and blue.  Keep the brace clean.  If the brace is not waterproof: ? Do not let it get wet. ? Cover it with a watertight covering when you take a bath or shower. If you were given an elastic bandage:   Remove it when you take a bath or a shower.  Try not to move your ankle very much, but wiggle your toes from time to time. This helps to prevent swelling.  Adjust the bandage to make it more comfortable if it feels too tight.  Loosen the bandage if you have numbness or tingling in your foot or if your foot turns cold and blue. Managing pain, stiffness, and swelling   If directed, put ice on the painful area. ? If you have a removable brace or elastic bandage, remove it as told by your health care provider. ? Put ice in a plastic bag. ? Place a towel between your skin and the bag. ? Leave the ice on for 20 minutes, 2-3 times a day.  Move your toes often to avoid stiffness and to  lessen swelling.  Raise (elevate) your ankle above the level of your heart while you are sitting or lying down. General instructions  Record information about your pain. Writing down the following may be helpful for you and your health care provider: ? How often you have ankle pain. ? Where the pain is located. ? What the pain feels like.  If treatment involves wearing a prescribed shoe or insole, make sure you wear it correctly and for as long as told by your health care provider.  Take over-the-counter and prescription medicines only as told by your health care provider.  Keep all follow-up visits as told by your health care provider. This is important. Contact a health care provider if:  Your pain gets worse.  Your pain is not relieved with medicines.  You have a fever or chills.  You are having more trouble with walking.  You have new symptoms. Get help right away if:  Your foot, leg, toes, or ankle: ? Tingles or becomes numb. ? Becomes swollen. ? Turns pale or blue. Summary  Ankle pain can occur on either side or the back of one ankle or both ankles.  Ankle pain may be sharp and burning or dull and aching.  Rest your ankle as told by your health care provider.   If told, apply ice to the area.  Take over-the-counter and prescription medicines only as told by your health care provider. This information is not intended to replace advice given to you by your health care provider. Make sure you discuss any questions you have with your health care provider. Document Revised: 03/04/2019 Document Reviewed: 05/22/2018 [employer] Patient Education  2020 [employer]  

## 2020-02-10 NOTE — Progress Notes (Signed)
Subjective:  Patient ID: Kevin [name], male    DOB: 1986/09/29, 34 y.o.   MRN: [id number]  Patient Care Team: Caryl Never as PCP - General (Physician Assistant)   Chief Complaint:  right ankle pain (no known injury )   HPI: Kevin A Baggerly is a 34 y.o. male presenting on 02/10/2020 for right ankle pain (no known injury )   Ankle Pain  The incident occurred more than 1 week ago. The injury mechanism was a twisting injury. The pain is present in the right ankle. The quality of the pain is described as aching and shooting. The pain is at a severity of 5/10. The pain is moderate. The pain has been intermittent since onset. Pertinent negatives include no inability to bear weight, loss of motion, loss of sensation, muscle weakness, numbness or tingling. He reports no foreign bodies present. The symptoms are aggravated by movement, weight bearing and palpation. He has tried acetaminophen and immobilization for the symptoms. The treatment provided no relief.     Relevant past medical, surgical, family, and social history reviewed and updated as indicated.  Allergies and medications reviewed and updated. Date reviewed: Chart in Epic.   Past Medical History:  Diagnosis Date  . Arthritis   . GERD (gastroesophageal reflux disease)     Past Surgical History:  Procedure Laterality Date  . APPENDECTOMY    . HERNIA REPAIR Left    x 2 inguinal  . Wrist surgery       Social History   Socioeconomic History  . Marital status: Married    Spouse name: Not on file  . Number of children: Not on file  . Years of education: Not on file  . Highest education level: Not on file  Occupational History  . Not on file  Tobacco Use  . Smoking status: Current Every Day Smoker    Packs/day: 1.00    Years: 8.00    Pack years: 8.00    Types: Cigarettes  . Smokeless tobacco: Current User  Substance and Sexual Activity  . Alcohol use: No  . Drug use: No    Types: Marijuana  . Sexual  activity: Yes  Other Topics Concern  . Not on file  Social History Narrative  . Not on file   Social Determinants of Health   Financial Resource Strain:   . Difficulty of Paying Living Expenses:   Food Insecurity:   . Worried About Programme researcher, broadcasting/film/video in the Last Year:   . Barista in the Last Year:   Transportation Needs:   . Freight forwarder (Medical):   Marland Kitchen Lack of Transportation (Non-Medical):   Physical Activity:   . Days of Exercise per Week:   . Minutes of Exercise per Session:   Stress:   . Feeling of Stress :   Social Connections:   . Frequency of Communication with Friends and Family:   . Frequency of Social Gatherings with Friends and Family:   . Attends Religious Services:   . Active Member of Clubs or Organizations:   . Attends Banker Meetings:   Marland Kitchen Marital Status:   Intimate Partner Violence:   . Fear of Current or Ex-Partner:   . Emotionally Abused:   Marland Kitchen Physically Abused:   . Sexually Abused:     Outpatient Encounter Medications as of 02/10/2020  Medication Sig  . baclofen (LIORESAL) 10 MG tablet Take 1 tablet (10 mg total) by mouth 3 (three)  times daily. (Patient not taking: Reported on 02/10/2020)  . gabapentin (NEURONTIN) 100 MG capsule Take 1-3 capsules (100-300 mg total) by mouth at bedtime. (Patient not taking: Reported on 02/10/2020)  . ibuprofen (ADVIL) 600 MG tablet Take 600 mg by mouth every 6 (six) hours as needed.  . predniSONE (DELTASONE) 20 MG tablet 2 po at sametime daily for 5 days  . [DISCONTINUED] predniSONE (DELTASONE) 20 MG tablet 2 po at sametime daily for 5 days   No facility-administered encounter medications on file as of 02/10/2020.    No Known Allergies  Review of Systems  Constitutional: Negative for activity change, appetite change, chills, diaphoresis, fatigue, fever and unexpected weight change.  HENT: Negative.   Eyes: Negative.   Respiratory: Negative for cough, chest tightness and shortness of  breath.   Cardiovascular: Negative for chest pain, palpitations and leg swelling.  Gastrointestinal: Negative for abdominal pain, blood in stool, constipation, diarrhea, nausea and vomiting.  Endocrine: Negative.   Genitourinary: Negative for dysuria, frequency and urgency.  Musculoskeletal: Positive for arthralgias, gait problem and myalgias. Negative for back pain, joint swelling, neck pain and neck stiffness.  Skin: Negative.   Allergic/Immunologic: Negative.   Neurological: Negative for dizziness, tingling, tremors, seizures, syncope, facial asymmetry, speech difficulty, weakness, light-headedness, numbness and headaches.  Hematological: Negative.   Psychiatric/Behavioral: Negative for confusion, hallucinations, sleep disturbance and suicidal ideas.  All other systems reviewed and are negative.       Objective:  BP 116/79   Pulse 75   Temp 99 F (37.2 C)   Resp 20   Ht 5\' 7"  (1.702 m)   Wt 171 lb (77.6 kg)   SpO2 99%   BMI 26.78 kg/m    Wt Readings from Last 3 Encounters:  02/10/20 171 lb (77.6 kg)  11/10/19 172 lb (78 kg)  09/09/19 175 lb (79.4 kg)    Physical Exam Vitals and nursing note reviewed.  Constitutional:      General: He is not in acute distress.    Appearance: Normal appearance. He is well-developed and well-groomed. He is not ill-appearing, toxic-appearing or diaphoretic.  HENT:     Head: Normocephalic and atraumatic.     Jaw: There is normal jaw occlusion.     Right Ear: Hearing normal.     Left Ear: Hearing normal.     Nose: Nose normal.     Mouth/Throat:     Lips: Pink.     Mouth: Mucous membranes are moist.     Pharynx: Oropharynx is clear. Uvula midline.  Eyes:     General: Lids are normal.     Extraocular Movements: Extraocular movements intact.     Conjunctiva/sclera: Conjunctivae normal.     Pupils: Pupils are equal, round, and reactive to light.  Neck:     Thyroid: No thyroid mass, thyromegaly or thyroid tenderness.     Vascular: No  carotid bruit or JVD.     Trachea: Trachea and phonation normal.  Cardiovascular:     Rate and Rhythm: Normal rate and regular rhythm.     Chest Wall: PMI is not displaced.     Pulses: Normal pulses.     Heart sounds: Normal heart sounds. No murmur. No friction rub. No gallop.   Pulmonary:     Effort: Pulmonary effort is normal. No respiratory distress.     Breath sounds: Normal breath sounds. No wheezing.  Abdominal:     General: Bowel sounds are normal. There is no distension or abdominal bruit.  Palpations: Abdomen is soft. There is no hepatomegaly or splenomegaly.     Tenderness: There is no abdominal tenderness. There is no right CVA tenderness or left CVA tenderness.     Hernia: No hernia is present.  Musculoskeletal:        General: Normal range of motion.     Cervical back: Normal range of motion and neck supple.     Right knee: Normal.     Right lower leg: Normal. No edema.     Left lower leg: No edema.     Right ankle: No swelling, deformity, ecchymosis or lacerations. Tenderness (posterior) present. Normal range of motion. Normal pulse.     Right Achilles Tendon: Tenderness present. No defects. Thompson's test negative.     Right foot: Normal.  Lymphadenopathy:     Cervical: No cervical adenopathy.  Skin:    General: Skin is warm and dry.     Capillary Refill: Capillary refill takes less than 2 seconds.     Coloration: Skin is not cyanotic, jaundiced or pale.     Findings: No rash.  Neurological:     General: No focal deficit present.     Mental Status: He is alert and oriented to person, place, and time.     Cranial Nerves: Cranial nerves are intact. No cranial nerve deficit.     Sensory: Sensation is intact. No sensory deficit.     Motor: Motor function is intact. No weakness.     Coordination: Coordination is intact. Coordination normal.     Gait: Gait abnormal (antalgic).     Deep Tendon Reflexes: Reflexes are normal and symmetric. Reflexes normal.    Psychiatric:        Attention and Perception: Attention and perception normal.        Mood and Affect: Mood and affect normal.        Speech: Speech normal.        Behavior: Behavior normal. Behavior is cooperative.        Thought Content: Thought content normal.        Cognition and Memory: Cognition and memory normal.        Judgment: Judgment normal.     Results for orders placed or performed during the hospital encounter of 02/23/14  Culture, Group A Strep   Specimen: Throat  Result Value Ref Range   Specimen Description THROAT    Special Requests NONE    Culture      No Beta Hemolytic Streptococci Isolated Performed at [facility]   Report Status 02/25/2014 FINAL   POCT rapid strep A [facility])  Result Value Ref Range   Streptococcus, Group A Screen (Direct) NEGATIVE NEGATIVE     X-Ray: right ankle: Concerning for metatarsal fracture, will notify pt of radiology reading. Preliminary x-ray reading by Monia Pouch, FNP-C, [facility].   Pertinent labs & imaging results that were available during my care of the patient were reviewed by me and considered in my medical decision making.  Assessment & Plan:  Mali was seen today for right ankle pain.  Diagnoses and all orders for this visit:  Acute right ankle pain Xray concerning for metatarsal fracture. Will notify pt of radiology reading. Walking boot applied in office.  -     DG Ankle Complete Right; Future  Tendon pain Achilles pain, negative Thompson test. Pain is intermittent. Walking boot applied in office. Symptomatic care discussed in detail. Steroid burst. Pt aware to report any new, worsening, or persistent symptoms.  -  predniSONE (DELTASONE) 20 MG tablet; 2 po at sametime daily for 5 days     Continue all other maintenance medications.  Follow up plan: Return in 4 weeks (on 03/09/2020), or if symptoms worsen or fail to improve.  Continue healthy lifestyle choices, including diet (rich in  fruits, vegetables, and lean proteins, and low in salt and simple carbohydrates) and exercise (at least 30 minutes of moderate physical activity daily).  Educational handout given for ankle pain  The above assessment and management plan was discussed with the patient. The patient verbalized understanding of and has agreed to the management plan. Patient is aware to call the clinic if they develop any new symptoms or if symptoms persist or worsen. Patient is aware when to return to the clinic for a follow-up visit. Patient educated on when it is appropriate to go to the emergency department.   Kari Baars, FNP-C [facility] [phone]

## 2020-03-01 ENCOUNTER — Telehealth: Payer: Self-pay | Admitting: Physician Assistant

## 2020-03-01 NOTE — Telephone Encounter (Signed)
Aware.  May remove boot if no longer having pain.

## 2020-05-04 ENCOUNTER — Other Ambulatory Visit: Payer: Self-pay

## 2020-05-04 ENCOUNTER — Ambulatory Visit: Payer: 59 | Attending: Internal Medicine

## 2020-05-04 DIAGNOSIS — Z20822 Contact with and (suspected) exposure to covid-19: Secondary | ICD-10-CM | POA: Insufficient documentation

## 2020-05-05 LAB — SPECIMEN STATUS REPORT

## 2020-05-05 LAB — SARS-COV-2, NAA 2 DAY TAT

## 2020-05-05 LAB — NOVEL CORONAVIRUS, NAA: SARS-CoV-2, NAA: NOT DETECTED

## 2020-07-13 ENCOUNTER — Encounter: Payer: Self-pay | Admitting: Family Medicine

## 2020-07-13 ENCOUNTER — Other Ambulatory Visit: Payer: Self-pay

## 2020-07-13 ENCOUNTER — Ambulatory Visit (INDEPENDENT_AMBULATORY_CARE_PROVIDER_SITE_OTHER): Payer: 59 | Admitting: Family Medicine

## 2020-07-13 VITALS — BP 121/75 | HR 77 | Temp 98.2°F

## 2020-07-13 DIAGNOSIS — R05 Cough: Secondary | ICD-10-CM

## 2020-07-13 DIAGNOSIS — J988 Other specified respiratory disorders: Secondary | ICD-10-CM

## 2020-07-13 DIAGNOSIS — R0981 Nasal congestion: Secondary | ICD-10-CM

## 2020-07-13 DIAGNOSIS — M62838 Other muscle spasm: Secondary | ICD-10-CM

## 2020-07-13 DIAGNOSIS — M25512 Pain in left shoulder: Secondary | ICD-10-CM

## 2020-07-13 DIAGNOSIS — M791 Myalgia, unspecified site: Secondary | ICD-10-CM

## 2020-07-13 DIAGNOSIS — R059 Cough, unspecified: Secondary | ICD-10-CM

## 2020-07-13 MED ORDER — METHYLPREDNISOLONE 4 MG PO TBPK: ORAL_TABLET | ORAL | 0 refills | Status: DC

## 2020-07-13 MED ORDER — AMOXICILLIN-POT CLAVULANATE 875-125 MG PO TABS: 1.0000 | ORAL_TABLET | Freq: Two times a day (BID) | ORAL | 0 refills | Status: DC

## 2020-07-13 MED ORDER — BACLOFEN 10 MG PO TABS
10.0000 mg | ORAL_TABLET | Freq: Three times a day (TID) | ORAL | 0 refills | Status: DC | PRN
Start: 2020-07-13 — End: 2021-06-20

## 2020-07-13 NOTE — Progress Notes (Signed)
Assessment & Plan:  1. Respiratory infection - Discussed symptom management.  - methylPREDNISolone (MEDROL DOSEPAK) 4 MG TBPK tablet; Use as directed  Dispense: 21 each; Refill: 0 - amoxicillin-clavulanate (AUGMENTIN) 875-125 MG tablet; Take 1 tablet by mouth 2 (two) times daily.  Dispense: 20 tablet; Refill: 0  2. Cough - Novel Coronavirus, NAA ([facility]) - SARS-COV-2, NAA 2 DAY TAT  3. Muscle pain - Patient requested refill while present.  - baclofen (LIORESAL) 10 MG tablet; Take 1 tablet (10 mg total) by mouth 3 (three) times daily as needed for muscle spasms.  Dispense: 30 each; Refill: 0   Follow up plan: Return if symptoms worsen or fail to improve.  Kevin Boston, MSN, APRN, FNP-C [name]  Subjective:   Patient ID: Kevin [name], male    DOB: 1986/11/03, 34 y.o.   MRN: [id number]  HPI: Kevin [name] is a 34 y.o. male presenting on 07/13/2020 for Cough (x 4-5 days), Nasal Congestion, and Back Pain (Patient states he thinks he pulled something when he was coughing and started having back pain since Saturday.)  Patient complains of cough, chest congestion, shortness of breath and wheezing. Additional symptoms include back pain from coughing. Onset of symptoms was 5 days ago, unchanged since that time. He is drinking plenty of fluids. Evaluation to date: none. Treatment to date: Advil for pain.  He does smoke.   ROS: Negative unless specifically indicated above in HPI.   Relevant past medical history reviewed and updated as indicated.   Allergies and medications reviewed and updated.   Current Outpatient Medications:  .  baclofen (LIORESAL) 10 MG tablet, Take 1 tablet (10 mg total) by mouth 3 (three) times daily., Disp: 30 each, Rfl: 0 .  gabapentin (NEURONTIN) 100 MG capsule, Take 1-3 capsules (100-300 mg total) by mouth at bedtime., Disp: 90 capsule, Rfl: 3 .  ibuprofen (ADVIL) 600 MG tablet, Take 600 mg by mouth every 6 (six) hours as  needed., Disp: , Rfl:   No Known Allergies  Objective:   BP 121/75   Pulse 77   Temp 98.2 F (36.8 C) (Temporal)   SpO2 98%    Physical Exam Vitals reviewed.  Constitutional:      General: He is not in acute distress.    Appearance: Normal appearance. He is not ill-appearing, toxic-appearing or diaphoretic.  HENT:     Head: Normocephalic and atraumatic.     Right Ear: Tympanic membrane, ear canal and external ear normal. There is no impacted cerumen.     Left Ear: Tympanic membrane, ear canal and external ear normal. There is no impacted cerumen.     Nose: Nose normal.     Mouth/Throat:     Mouth: Mucous membranes are moist.     Pharynx: Oropharynx is clear. No oropharyngeal exudate or posterior oropharyngeal erythema.  Eyes:     General: No scleral icterus.       Right eye: No discharge.        Left eye: No discharge.     Conjunctiva/sclera: Conjunctivae normal.  Cardiovascular:     Rate and Rhythm: Normal rate and regular rhythm.     Heart sounds: Normal heart sounds. No murmur heard.  No friction rub. No gallop.   Pulmonary:     Effort: Pulmonary effort is normal. No respiratory distress.     Breath sounds: Normal breath sounds. No stridor. No wheezing, rhonchi or rales.  Musculoskeletal:        General: Normal  range of motion.     Cervical back: Normal range of motion.  Lymphadenopathy:     Cervical: No cervical adenopathy.  Skin:    General: Skin is warm and dry.  Neurological:     Mental Status: He is alert and oriented to person, place, and time. Mental status is at baseline.  Psychiatric:        Mood and Affect: Mood normal.        Behavior: Behavior normal.        Thought Content: Thought content normal.        Judgment: Judgment normal.

## 2020-07-14 LAB — NOVEL CORONAVIRUS, NAA: SARS-CoV-2, NAA: NOT DETECTED

## 2020-07-14 LAB — SARS-COV-2, NAA 2 DAY TAT

## 2020-07-19 ENCOUNTER — Encounter: Payer: Self-pay | Admitting: Family Medicine

## 2020-12-31 ENCOUNTER — Ambulatory Visit: Payer: 59 | Admitting: Family Medicine

## 2020-12-31 ENCOUNTER — Encounter: Payer: Self-pay | Admitting: Family Medicine

## 2020-12-31 ENCOUNTER — Other Ambulatory Visit: Payer: Self-pay

## 2020-12-31 VITALS — BP 140/82 | HR 82 | Temp 98.2°F | Ht 67.0 in | Wt 172.2 lb

## 2020-12-31 DIAGNOSIS — M19041 Primary osteoarthritis, right hand: Secondary | ICD-10-CM

## 2020-12-31 DIAGNOSIS — M5137 Other intervertebral disc degeneration, lumbosacral region: Secondary | ICD-10-CM | POA: Diagnosis not present

## 2020-12-31 MED ORDER — PREDNISONE 10 MG (21) PO TBPK: ORAL_TABLET | ORAL | 2 refills | Status: DC

## 2020-12-31 MED ORDER — DICLOFENAC SODIUM 75 MG PO TBEC: 75.0000 mg | DELAYED_RELEASE_TABLET | Freq: Two times a day (BID) | ORAL | 2 refills | Status: DC

## 2020-12-31 NOTE — Progress Notes (Signed)
Assessment & Plan:  1. Degenerative disc disease at L5-S1 level - Rx'd prednisone taper with refills to take the few times a year that his back acts up on him. - predniSONE (STERAPRED UNI-PAK 21 TAB) 10 MG (21) TBPK tablet; As directed x 6 days  Dispense: 21 tablet; Refill: 2 - diclofenac (VOLTAREN) 75 MG EC tablet; Take 1 tablet (75 mg total) by mouth 2 (two) times daily.  Dispense: 60 tablet; Refill: 2 - MR Lumbar Spine Wo Contrast; Future  2. Arthritis of right hand - Rx'd diclofenac twice daily for regular daily pain. - diclofenac (VOLTAREN) 75 MG EC tablet; Take 1 tablet (75 mg total) by mouth 2 (two) times daily.  Dispense: 60 tablet; Refill: 2   Return in about 4 weeks (around 01/28/2021) for pain.  [name], MSN, APRN, FNP-C [name]  Subjective:    Patient ID: Kevin [name], male    DOB: 05/15/1986, 35 y.o.   MRN: [id number]  Patient Care Team: Gwenlyn Fudge, FNP as PCP - General (Family Medicine)   Chief Complaint:  Chief Complaint  Patient presents with  . Pain    Patient states that he has been having chronic back pain but has gotten worse.   . Hand Pain    Patient states he crushed his right hand years ago but has gotten worse.  Requesting pain meds    HPI: Kevin [name] is a 35 y.o. male presenting on 12/31/2020 for Pain (Patient states that he has been having chronic back pain but has gotten worse. ) and Hand Pain (Patient states he crushed his right hand years ago but has gotten worse.  Requesting pain meds)  Pain assessment: Cause of pain- crush injury of right hand and wrist; L5-S1 disc degeneration on previous MRI on 09/26/2017 Pain location- low back and right hand/wrist Pain on scale of 1-10- 12/10 when it acts up Frequency- a few times up to 6 times per year What increases pain- bending What makes pain better- a certain posture while he is laying on his side, steroid taper Effects on ADL - makes difficult  Prior  treatments tried and failed- patient goes to the chiropractor, plays disc golf, does yoga, is exercising regularly.  He definitely feels his pain is worse in the morning and improves as the day goes on.  He is requesting something for pain for when his back starts acting up really bad.  He would like to repeat his imaging of his back.  New complaints: None  Social history:  Relevant past medical, surgical, family and social history reviewed and updated as indicated. Interim medical history since our last visit reviewed.  Allergies and medications reviewed and updated.  DATA REVIEWED: CHART IN EPIC  ROS: Negative unless specifically indicated above in HPI.    Current Outpatient Medications:  .  baclofen (LIORESAL) 10 MG tablet, Take 1 tablet (10 mg total) by mouth 3 (three) times daily as needed for muscle spasms., Disp: 30 each, Rfl: 0 .  gabapentin (NEURONTIN) 100 MG capsule, Take 1-3 capsules (100-300 mg total) by mouth at bedtime., Disp: 90 capsule, Rfl: 3   No Known Allergies Past Medical History:  Diagnosis Date  . Arthritis   . GERD (gastroesophageal reflux disease)     Past Surgical History:  Procedure Laterality Date  . APPENDECTOMY    . HERNIA REPAIR Left    x 2 inguinal  . Wrist surgery       Social History  Socioeconomic History  . Marital status: Married    Spouse name: Not on file  . Number of children: Not on file  . Years of education: Not on file  . Highest education level: Not on file  Occupational History  . Not on file  Tobacco Use  . Smoking status: Current Every Day Smoker    Packs/day: 1.00    Years: 8.00    Pack years: 8.00    Types: Cigarettes  . Smokeless tobacco: Current User  Vaping Use  . Vaping Use: Never used  Substance and Sexual Activity  . Alcohol use: No  . Drug use: No    Types: Marijuana  . Sexual activity: Yes  Other Topics Concern  . Not on file  Social History Narrative  . Not on file   Social Determinants of Health    Financial Resource Strain: Not on file  Food Insecurity: Not on file  Transportation Needs: Not on file  Physical Activity: Not on file  Stress: Not on file  Social Connections: Not on file  Intimate Partner Violence: Not on file        Objective:    BP 140/82   Pulse 82   Temp 98.2 F (36.8 C) (Temporal)   Ht 5\' 7"  (1.702 m)   Wt 172 lb 3.2 oz (78.1 kg)   SpO2 96%   BMI 26.97 kg/m   Wt Readings from Last 3 Encounters:  12/31/20 172 lb 3.2 oz (78.1 kg)  02/10/20 171 lb (77.6 kg)  11/10/19 172 lb (78 kg)    Physical Exam Vitals reviewed.  Constitutional:      General: He is not in acute distress.    Appearance: Normal appearance. He is overweight. He is not ill-appearing, toxic-appearing or diaphoretic.  HENT:     Head: Normocephalic and atraumatic.  Eyes:     General: No scleral icterus.       Right eye: No discharge.        Left eye: No discharge.     Conjunctiva/sclera: Conjunctivae normal.  Cardiovascular:     Rate and Rhythm: Normal rate.  Pulmonary:     Effort: Pulmonary effort is normal. No respiratory distress.  Musculoskeletal:        General: Normal range of motion.     Cervical back: Normal range of motion.  Skin:    General: Skin is warm and dry.  Neurological:     Mental Status: He is alert and oriented to person, place, and time. Mental status is at baseline.  Psychiatric:        Mood and Affect: Mood normal.        Behavior: Behavior normal.        Thought Content: Thought content normal.        Judgment: Judgment normal.     No results found for: TSH Lab Results  Component Value Date   HGB 15.0 07/10/2011   HCT 44.0 07/10/2011   Lab Results  Component Value Date   NA 141 07/10/2011   K 4.6 07/10/2011   GLUCOSE 93 07/10/2011   BUN 15 07/10/2011   CREATININE 1.00 07/10/2011   No results found for: CHOL No results found for: HDL No results found for: LDLCALC No results found for: TRIG No results found for: CHOLHDL No  results found for: HGBA1C

## 2021-01-06 ENCOUNTER — Telehealth: Payer: Self-pay | Admitting: Family Medicine

## 2021-01-18 ENCOUNTER — Ambulatory Visit (HOSPITAL_COMMUNITY)
Admission: RE | Admit: 2021-01-18 | Discharge: 2021-01-18 | Disposition: A | Discharge status: Home / Self Care | Payer: 59 | Source: Ambulatory Visit | Attending: Family Medicine | Admitting: Family Medicine

## 2021-01-18 ENCOUNTER — Other Ambulatory Visit: Payer: Self-pay

## 2021-01-18 DIAGNOSIS — M5137 Other intervertebral disc degeneration, lumbosacral region: Secondary | ICD-10-CM | POA: Insufficient documentation

## 2021-01-20 ENCOUNTER — Other Ambulatory Visit: Payer: Self-pay

## 2021-01-20 ENCOUNTER — Encounter: Payer: Self-pay | Admitting: Family Medicine

## 2021-01-20 ENCOUNTER — Ambulatory Visit (INDEPENDENT_AMBULATORY_CARE_PROVIDER_SITE_OTHER): Payer: 59 | Admitting: Family Medicine

## 2021-01-20 VITALS — BP 137/86 | HR 84 | Temp 97.5°F | Ht 67.0 in | Wt 177.0 lb

## 2021-01-20 DIAGNOSIS — M5137 Other intervertebral disc degeneration, lumbosacral region: Secondary | ICD-10-CM | POA: Diagnosis not present

## 2021-01-20 DIAGNOSIS — M5136 Other intervertebral disc degeneration, lumbar region: Secondary | ICD-10-CM | POA: Insufficient documentation

## 2021-01-20 DIAGNOSIS — M5126 Other intervertebral disc displacement, lumbar region: Secondary | ICD-10-CM | POA: Diagnosis not present

## 2021-01-20 NOTE — Progress Notes (Signed)
Assessment & Plan:  1-3. Protrusion of lumbar intervertebral disc/Degenerative disc disease at L5-S1 level/Bulging lumbar disc Explained MRI results and answered all of patient's questions.  He will continue diclofenac for pain relief.  He does not wish to be referred to a neurosurgeon at this time.  Advised if his pain worsens and he would like to be referred to let me know.   Return in about 6 months (around 07/20/2021) for annual physical.  [name], MSN, APRN, FNP-C [name]  Subjective:    Patient ID: Kevin [name], male    DOB: October 02, 1986, 35 y.o.   MRN: [id number]  Patient Care Team: Gwenlyn Fudge, FNP as PCP - General (Family Medicine)   Chief Complaint:  Chief Complaint  Patient presents with  . Pain Management    4 week re check. Discuss MRI    HPI: Kevin [name] is a 35 y.o. male presenting on 01/20/2021 for Pain Management (4 week re check. Discuss MRI)  Patient is here to discuss his MRI results and his pain.  He has been taking diclofenac as needed, which she reports is helpful.  New complaints: None  Social history:  Relevant past medical, surgical, family and social history reviewed and updated as indicated. Interim medical history since our last visit reviewed.  Allergies and medications reviewed and updated.  DATA REVIEWED: CHART IN EPIC  ROS: Negative unless specifically indicated above in HPI.    Current Outpatient Medications:  .  baclofen (LIORESAL) 10 MG tablet, Take 1 tablet (10 mg total) by mouth 3 (three) times daily as needed for muscle spasms., Disp: 30 each, Rfl: 0 .  diclofenac (VOLTAREN) 75 MG EC tablet, Take 1 tablet (75 mg total) by mouth 2 (two) times daily., Disp: 60 tablet, Rfl: 2 .  gabapentin (NEURONTIN) 100 MG capsule, Take 1-3 capsules (100-300 mg total) by mouth at bedtime., Disp: 90 capsule, Rfl: 3 .  predniSONE (STERAPRED UNI-PAK 21 TAB) 10 MG (21) TBPK tablet, As directed x 6 days, Disp: 21  tablet, Rfl: 2   No Known Allergies Past Medical History:  Diagnosis Date  . Arthritis   . GERD (gastroesophageal reflux disease)     Past Surgical History:  Procedure Laterality Date  . APPENDECTOMY    . HERNIA REPAIR Left    x 2 inguinal  . Wrist surgery       Social History   Socioeconomic History  . Marital status: Married    Spouse name: Not on file  . Number of children: Not on file  . Years of education: Not on file  . Highest education level: Not on file  Occupational History  . Not on file  Tobacco Use  . Smoking status: Current Every Day Smoker    Packs/day: 1.00    Years: 8.00    Pack years: 8.00    Types: Cigarettes  . Smokeless tobacco: Current User  Vaping Use  . Vaping Use: Never used  Substance and Sexual Activity  . Alcohol use: No  . Drug use: No    Types: Marijuana  . Sexual activity: Yes  Other Topics Concern  . Not on file  Social History Narrative  . Not on file   Social Determinants of Health   Financial Resource Strain: Not on file  Food Insecurity: Not on file  Transportation Needs: Not on file  Physical Activity: Not on file  Stress: Not on file  Social Connections: Not on file  Intimate Partner Violence:  Not on file        Objective:    BP 137/86   Pulse 84   Temp (!) 97.5 F (36.4 C) (Temporal)   Ht 5\' 7"  (1.702 m)   Wt 177 lb (80.3 kg)   SpO2 98%   BMI 27.72 kg/m   Wt Readings from Last 3 Encounters:  01/20/21 177 lb (80.3 kg)  12/31/20 172 lb 3.2 oz (78.1 kg)  02/10/20 171 lb (77.6 kg)    Physical Exam Vitals reviewed.  Constitutional:      General: He is not in acute distress.    Appearance: Normal appearance. He is overweight. He is not ill-appearing, toxic-appearing or diaphoretic.  HENT:     Head: Normocephalic and atraumatic.  Eyes:     General: No scleral icterus.       Right eye: No discharge.        Left eye: No discharge.     Conjunctiva/sclera: Conjunctivae normal.  Cardiovascular:     Rate  and Rhythm: Normal rate.  Pulmonary:     Effort: Pulmonary effort is normal. No respiratory distress.  Musculoskeletal:        General: Normal range of motion.     Cervical back: Normal range of motion.  Skin:    General: Skin is warm and dry.  Neurological:     Mental Status: He is alert and oriented to person, place, and time. Mental status is at baseline.  Psychiatric:        Mood and Affect: Mood normal.        Behavior: Behavior normal.        Thought Content: Thought content normal.        Judgment: Judgment normal.     No results found for: TSH Lab Results  Component Value Date   HGB 15.0 07/10/2011   HCT 44.0 07/10/2011   Lab Results  Component Value Date   NA 141 07/10/2011   K 4.6 07/10/2011   GLUCOSE 93 07/10/2011   BUN 15 07/10/2011   CREATININE 1.00 07/10/2011   No results found for: CHOL No results found for: HDL No results found for: LDLCALC No results found for: TRIG No results found for: CHOLHDL No results found for: 07/12/2011

## 2021-01-28 ENCOUNTER — Ambulatory Visit: Payer: 59 | Admitting: Family Medicine

## 2021-06-09 IMAGING — MR MR LUMBAR SPINE W/O CM
5 series · 31 of 48 positions shown · non-contrast
Comparison: 07/11/2011 and prior.

CLINICAL DATA: Low back pain, > 6 wks Intervertebral disc
degeneration

EXAM:
MRI LUMBAR SPINE WITHOUT CONTRAST
TECHNIQUE: Multiplanar, multisequence MR imaging of the lumbar spine was
performed. No intravenous contrast was administered.

[Series 5: T2 · sagittal · 4.0mm · 0.68mm/px · 6 of 16 slices shown (1 of 2)]
[im 1/16]
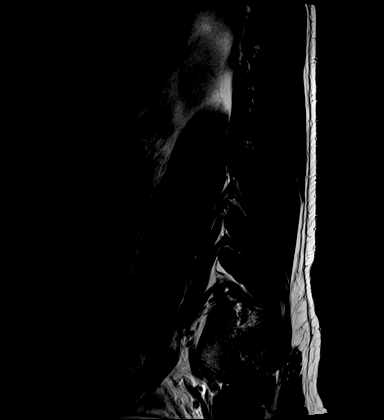
[im 4/16]
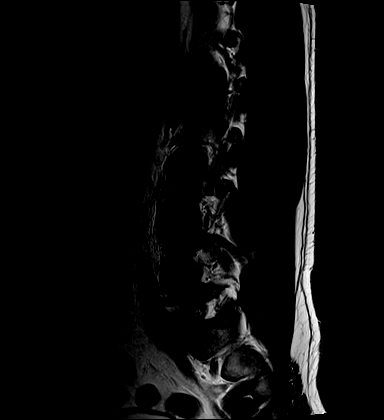
[im 7/16]
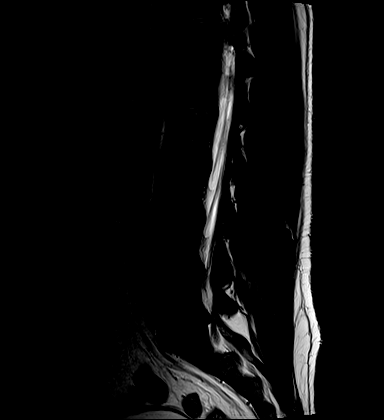
[im 10/16]
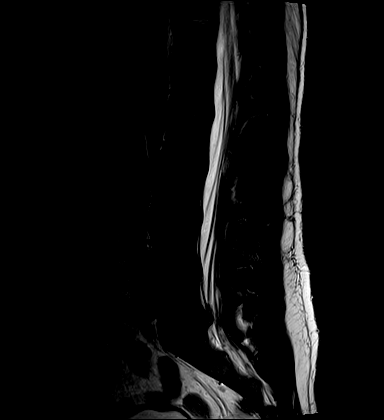
[im 13/16]
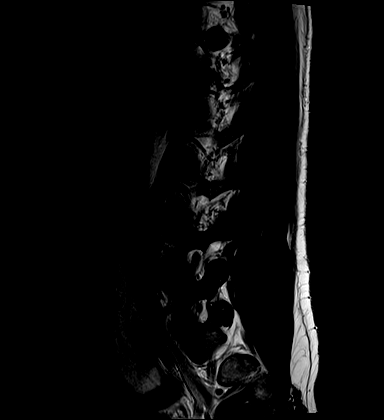
[im 16/16]
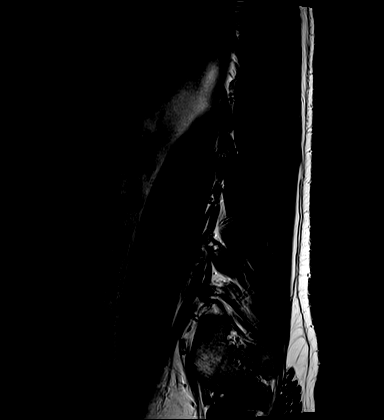

[Series 6: T1 · sagittal · 4.0mm · 0.81mm/px · 7 of 15 slices shown (1 of 2)]
[im 1/15]
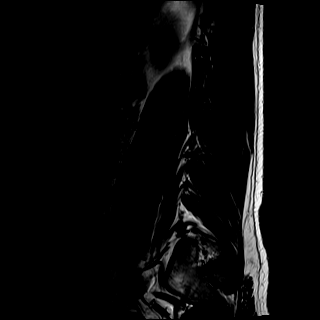
[im 3/15]
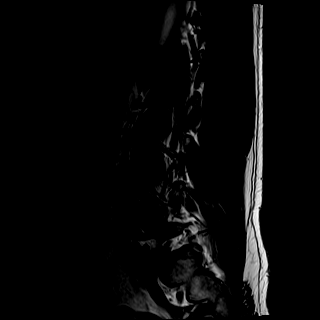
[im 5/15]
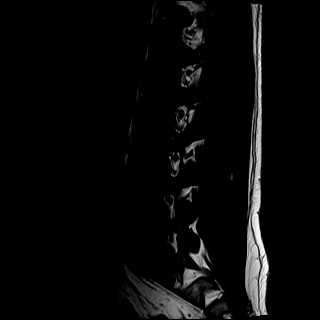
[im 8/15]
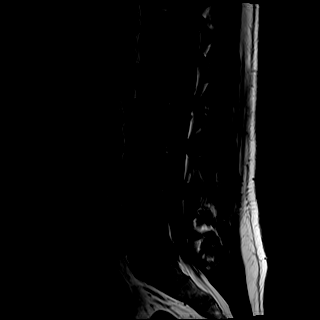
[im 10/15]
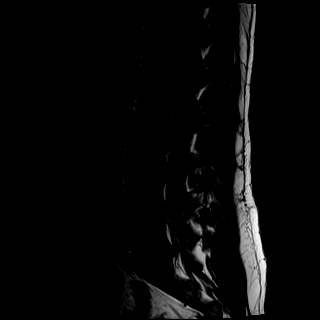
[im 12/15]
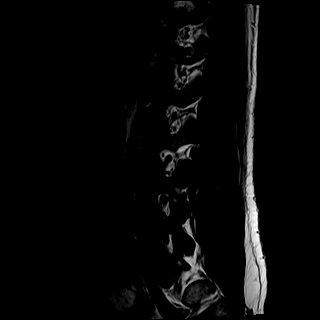
[im 15/15]
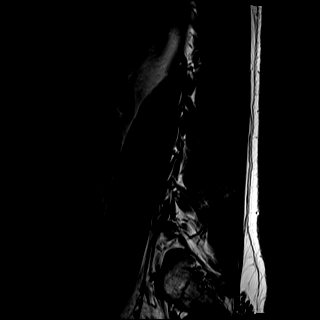

[Series 7: STIR · sagittal · 4.0mm · 0.51mm/px · 2 of 15 slices shown]
[im 1/15]
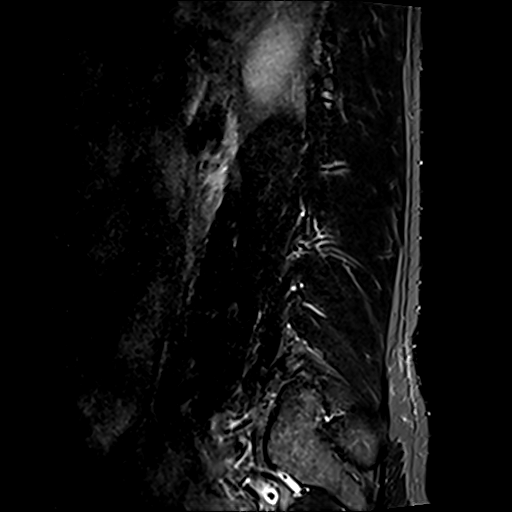
[im 3/15]
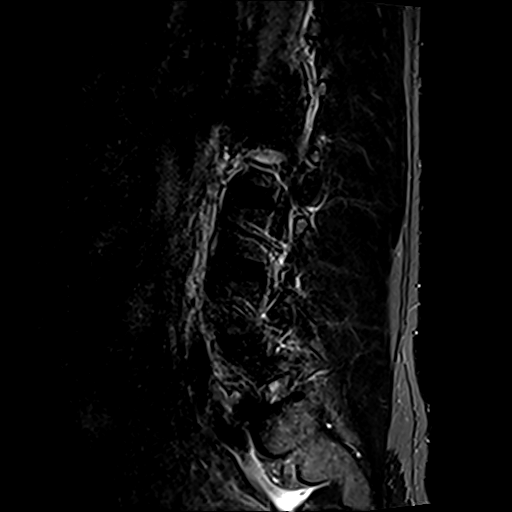

[Series 8: T2 · axial · 4.0mm · 0.70mm/px · z∈[-76,+102]mm · 8 of 32 slices shown (2 of 2)]
[im 1/32]
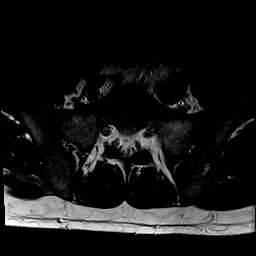
[im 5/32]
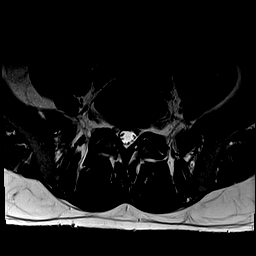
[im 10/32]
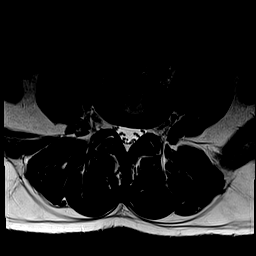
[im 15/32]
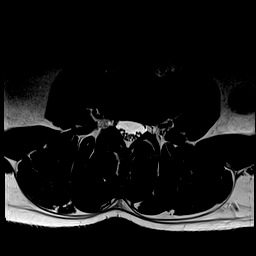
[im 17/32]
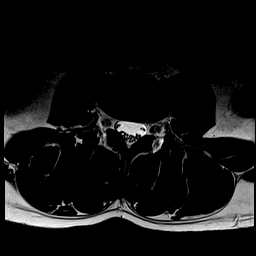
[im 22/32]
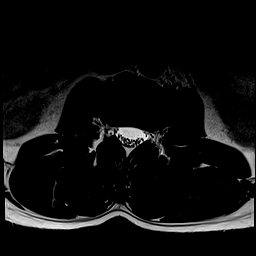
[im 27/32]
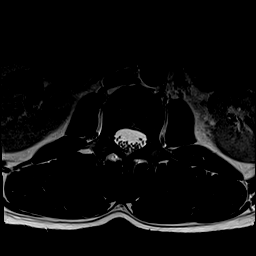
[im 32/32]
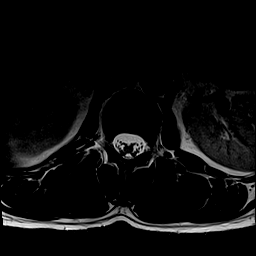

[Series 9: T1 · axial · 4.0mm · 0.35mm/px · z∈[-76,+102]mm · 8 of 32 slices shown (2 of 2)]
[im 1/32]
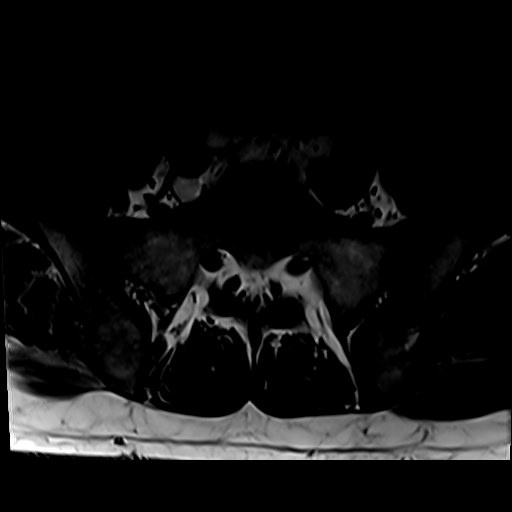
[im 5/32]
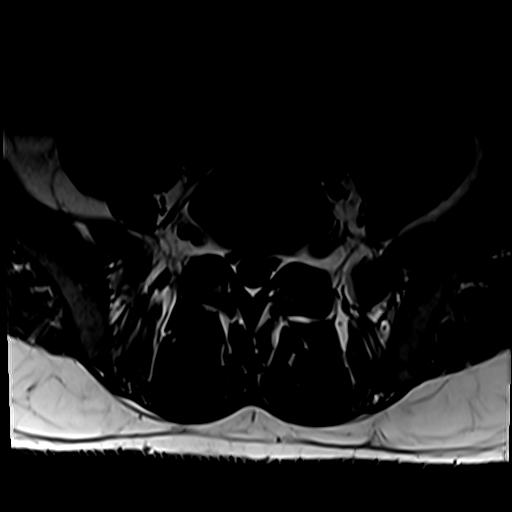
[im 10/32]
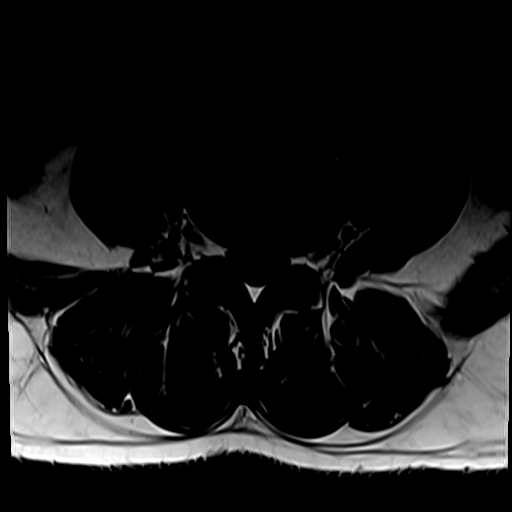
[im 15/32]
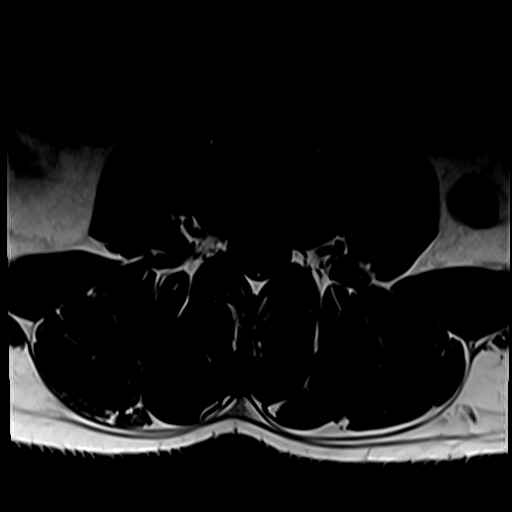
[im 17/32]
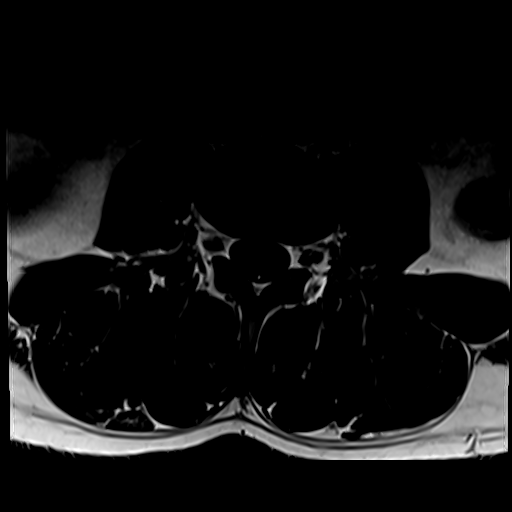
[im 22/32]
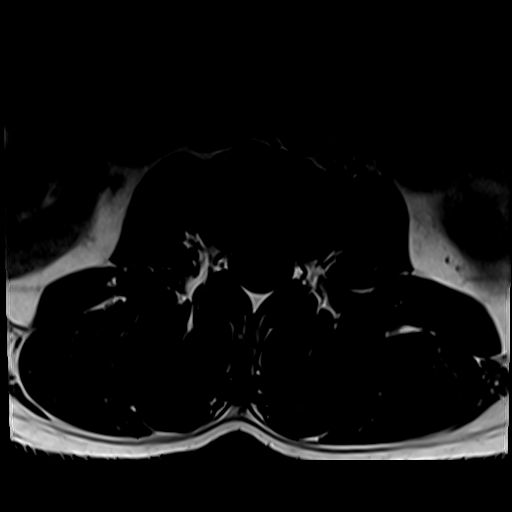
[im 27/32]
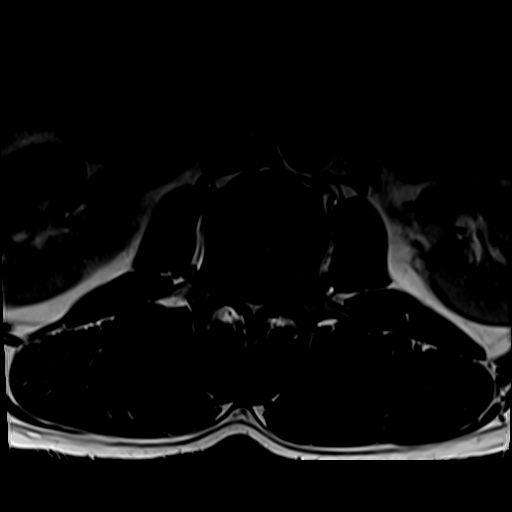
[im 32/32]
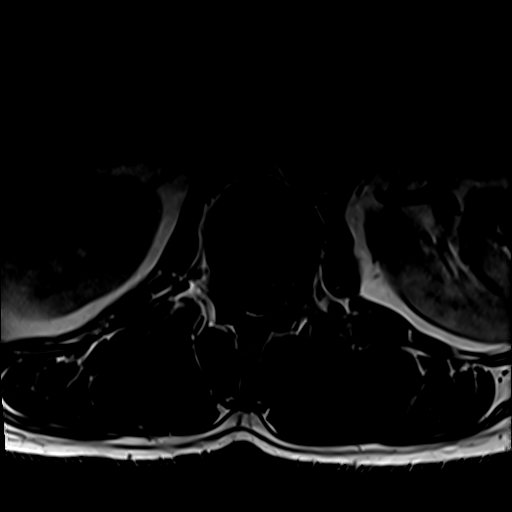

[31 of 48 positions shown; findings below may reference images not displayed]

FINDINGS: Segmentation:  Standard.

Alignment:  Straightening of lordosis.

Vertebrae: Normal bone marrow signal intensity. T12 hemangioma
versus focal fat.

Conus medullaris and cauda equina: Conus extends to the L2 level.
Conus and cauda equina appear normal.

Disc levels: L5-S1 desiccation with mild disc space loss.

L1-2: Negative

L2-3: Negative

L3-4: Negative

L4-5: Mild left predominant disc bulge. Patent spinal canal and
right neural foramen. Mild left neural foraminal narrowing.

L5-S1: Disc bulge with central protrusion/annular fissuring abutting
the descending S1 nerve roots. Patent spinal canal and neural
foramen.

Paraspinal and other soft tissues: Negative.
IMPRESSION: Mild left L4-5 neural foraminal narrowing. No significant spinal
canal narrowing.

Central L5-S1 protrusion/annular fissuring abutting the descending
S1 nerve roots without significant stenosis.

## 2021-06-20 ENCOUNTER — Encounter: Payer: Self-pay | Admitting: Nurse Practitioner

## 2021-06-20 ENCOUNTER — Other Ambulatory Visit: Payer: Self-pay

## 2021-06-20 ENCOUNTER — Ambulatory Visit (INDEPENDENT_AMBULATORY_CARE_PROVIDER_SITE_OTHER): Payer: 59 | Admitting: Nurse Practitioner

## 2021-06-20 VITALS — BP 117/71 | HR 69 | Temp 97.5°F | Resp 20 | Ht 67.0 in | Wt 169.0 lb

## 2021-06-20 DIAGNOSIS — S161XXA Strain of muscle, fascia and tendon at neck level, initial encounter: Secondary | ICD-10-CM | POA: Diagnosis not present

## 2021-06-20 MED ORDER — PREDNISONE 10 MG (21) PO TBPK: ORAL_TABLET | ORAL | 0 refills | Status: DC

## 2021-06-20 MED ORDER — CYCLOBENZAPRINE HCL 10 MG PO TABS: 10.0000 mg | ORAL_TABLET | Freq: Three times a day (TID) | ORAL | 1 refills | Status: DC | PRN

## 2021-06-20 NOTE — Progress Notes (Addendum)
   Subjective:    Patient ID: Kevin [name], male    DOB: 09-Sep-1986, 35 y.o.   MRN: [id number]   Chief Complaint: Shoulder Pain (Left shoulder. Hurt yesterday. Also causing neck pain/)   HPI Patient in today c/o left shoulder and neck pain. Started yesterday evening. He was playing disc golf and and threw [name] shot and over extended his arm. Has been hurting every since. He took some advil last night which helped [name] little. Had trouble sleeping last night due to pain. Rate Kevin [name] 5-6/10 currnetly. Moving certain ways increases pain.    Review of Systems  Musculoskeletal:  Positive for neck pain.      Objective:   Physical Exam Vitals and nursing note reviewed.  Constitutional:      Appearance: Normal appearance.  Cardiovascular:     Rate and Rhythm: Normal rate and regular rhythm.     Heart sounds: Normal heart sounds.  Pulmonary:     Effort: Pulmonary effort is normal.     Breath sounds: Normal breath sounds.  Musculoskeletal:     Comments: Decrease ROM of cervical spine with pain on flexion and leaning head to right. No point tenderness noted Grips qual bil Mototr strength and sensation distally intact  Skin:    General: Skin is warm and dry.  Neurological:     General: No focal deficit present.     Mental Status: He is alert and oriented to person, place, and time.  Psychiatric:        Mood and Affect: Mood normal.        Behavior: Behavior normal.   BP 117/71   Pulse 69   Temp (!) 97.5 F (36.4 C) (Temporal)   Resp 20   Ht 5\' 7"  (1.702 m)   Wt 169 lb (76.7 kg)   SpO2 98%   BMI 26.47 kg/m         Assessment & Plan:  Kevin [name] in today with chief complaint of Shoulder Pain (Left shoulder. Hurt yesterday. Also causing neck pain/)   1. Strain of neck muscle, initial encounter Moist heat Rest Stretches while in shower with warm water beating down on neck Sedation precautions with flexeril RTO prn  Meds ordered this encounter  Medications    cyclobenzaprine (FLEXERIL) 10 MG tablet    Sig: Take 1 tablet (10 mg total) by mouth 3 (three) times daily as needed for muscle spasms.    Dispense:  30 tablet    Refill:  1    Order Specific Question:   Supervising Provider    Answer:   Kevin [name] [[id number]]   predniSONE (STERAPRED UNI-PAK 21 TAB) 10 MG (21) TBPK tablet    Sig: As directed x 6 days    Dispense:  21 tablet    Refill:  0    Order Specific Question:   Supervising Provider    Answer:   Kevin [name] [name] [[id number]]     The above assessment and management plan was discussed with the patient. The patient verbalized understanding of and has agreed to the management plan. Patient is aware to call the clinic if symptoms persist or worsen. Patient is aware when to return to the clinic for [name] follow-up visit. Patient educated on when it is appropriate to go to the emergency department.   Mary-Margaret Kevin Care, FNP

## 2021-06-20 NOTE — Patient Instructions (Signed)
Cervical Sprain ?A cervical sprain is also called a neck sprain. It is a stretch or tear in one or more ligaments in the neck. Ligaments are tissues that connect bones to each other. ?Neck sprains can be mild, bad, or very bad. A very bad sprain in the neck can cause the bones in the neck to be unstable. This can damage the spinal cord. It can also cause serious problems in the brain, spinal cord, and nerves (nervous system). ?Most neck sprains heal in 4-6 weeks. It can take more or less time depending on: ?What caused the injury. ?The amount of injury. ?What are the causes? ?Neck sprains may be caused by trauma, such as: ?An injury from an accident in a vehicle such as a car or boat. ?A fall. ?The head and neck being moved front to back or side to side all of a sudden (whiplash injury). ?Mild neck sprains may be caused by wear and tear over time. ?What increases the risk? ?The following factors may make you more likely to develop this condition: ?Taking part in activities that put you at high risk of hurting your neck. These include: ?Contact sports. ?Car racing. ?Gymnastics. ?Diving. ?Taking risks when driving or riding in a vehicle such as a car or boat. ?Arthritis caused by wear and tear of the joints in the spine. ?The neck not being very strong or flexible. ?Having had a neck injury in the past. ?Poor posture. ?Spending a lot of time in certain positions that put stress on the neck. This may be from sitting at a computer for a long time. ?What are the signs or symptoms? ?Symptoms of this condition include: ?Your neck, shoulders, or upper back feeling: ?Painful or sore. ?Stiff. ?Tender. ?Swollen. ?Hot, or like it is burning. ?Sudden tightening of neck muscles (spasms). ?Not being able to move the neck very much. ?Headache. ?Feeling dizzy. ?Feeling like you may vomit, or vomiting. ?Having a hand or arm that: ?Feels weak. ?Loses feeling (feels numb). ?Tingles. ?You may get symptoms right away after injury, or you  may get them over a few days. In some cases, symptoms may go away with treatment and come back over time. ?How is this treated? ?This condition is treated by: ?Resting your neck. ?Icing the part of your neck that is hurt. ?Doing exercises to restore movement and strength to your neck (physical therapy). ?If there is no swelling, you may use heat therapy 2-3 days after the injury took place. If your injury is very bad, treatment may also include: ?Keeping your neck in place for a length of time. This may be done using: ?A neck collar. This supports your chin and the back of your head. ?A cervical traction device. This is a sling that holds up your head. The sling removes weight and pressure from your neck. It may also help to relieve pain. ?Medicines that help with: ?Pain. ?Irritation and swelling (inflammation). ?Medicines that help to relax your muscles (muscle relaxants). ?Surgery. This is rare. ?Follow these instructions at home: ?Medicines ? ?Take over-the-counter and prescription medicines only as told by your doctor. ?Ask your doctor if the medicine prescribed to you: ?Requires you to avoid driving or using heavy machinery. ?Can cause trouble pooping (constipation). You may need to take these actions to prevent or treat trouble pooping: ?Drink enough fluid to keep your pee (urine) pale yellow. ?Take over-the-counter or prescription medicines. ?Eat foods that are high in fiber. These include beans, whole grains, and fresh fruits and vegetables. ?Limit   foods that are high in fat and sugar. These include fried or sweet foods. ?If you have a neck collar: ?Wear it as told by your doctor. Do not take it off unless told. ?Ask your doctor before adjusting your collar. ?If you have long hair, keep it outside of the collar. ?Ask your doctor if you may take off the collar for cleaning and bathing. If you may take off the collar: ?Follow instructions about how to take it off safely. ?Clean it by hand with mild soap and  water. Let it air-dry fully. ?If your collar has pads that you can take out: ?Take the pads out every 1-2 days. ?Wash them by hand with soap and water. ?Let the pads air-dry fully before you put them back in the collar. ?Tell your doctor if your skin under the collar has irritation or sores. ?Managing pain, stiffness, and swelling ?  ?Use a cervical traction device, if told by your doctor. ?If told, put ice on the affected area. To do this: ?Put ice in a plastic bag. ?Place a towel between your skin and the bag. ?Leave the ice on for 20 minutes, 2-3 times a day. ?If told, put heat on the affected area. Do this before exercise or as often as told by your doctor. Use the heat source that your doctor recommends, such as a moist heat pack or a heating pad. ?Place a towel between your skin and the heat source. ?Leave the heat on for 20-30 minutes. ?Take the heat off if your skin turns bright red. This is very important if you cannot feel pain, heat, or cold. You may have a greater risk of getting burned. ?Activity ?Do not drive while wearing a neck collar. If you do not have a neck collar, ask if it is safe to drive while your neck heals. ?Do not lift anything that is heavier than 10 lb (4.5 kg), or the limit that you are told, until your doctor tells you that it is safe. ?Rest as told by your doctor. ?Do exercises as told by your doctor or physical therapist. ?Return to your normal activities as told by your doctor. Avoid positions and activities that make you feel worse. Ask your doctor what activities are safe for you. ?General instructions ?Do not use any products that contain nicotine or tobacco, such as cigarettes, e-cigarettes, and chewing tobacco. These can delay healing. If you need help quitting, ask your doctor. ?Keep all follow-up visits as told by your doctor or physical therapist. This is important. ?How is this prevented? ?To prevent a neck sprain from happening again: ?Practice good posture. Adjust your  workstation to help you do this. ?Exercise regularly as told by your doctor or physical therapist. ?Avoid activities that are risky or may cause a neck sprain. ?Contact a doctor if: ?Your symptoms get worse. ?Your symptoms do not get better after 2 weeks of treatment. ?Your pain gets worse. ?Medicine does not help your pain. ?You have new symptoms that you cannot explain. ?Your neck collar gives you sores on your skin or bothers your skin. ?Get help right away if: ?You have very bad pain. ?You get any of the following in any part of your body: ?Loss of feeling. ?Tingling. ?Weakness. ?You cannot move a part of your body. ?You have neck pain and either of these: ?Very bad dizziness. ?A very bad headache. ?Summary ?A cervical sprain is also called a neck sprain. It is a stretch or tear in one or more ligaments in   the neck. Ligaments are tissues that connect bones. ?Neck sprains may be caused by trauma, such as an injury or a fall. ?You may get symptoms right away after injury, or you may get them over a few days. ?Neck sprains may be treated with rest, heat, ice, medicines, exercise, and surgery. ?This information is not intended to replace advice given to you by your health care provider. Make sure you discuss any questions you have with your health care provider. ?Document Revised: 07/23/2019 Document Reviewed: 07/23/2019 ?Elsevier Patient Education ? 2022 [employer]vier Inc[employer] ? ?

## 2021-06-23 ENCOUNTER — Encounter: Payer: Self-pay | Admitting: Family Medicine

## 2021-06-23 ENCOUNTER — Other Ambulatory Visit: Payer: Self-pay

## 2021-06-23 ENCOUNTER — Ambulatory Visit (INDEPENDENT_AMBULATORY_CARE_PROVIDER_SITE_OTHER): Payer: 59 | Admitting: Family Medicine

## 2021-06-23 VITALS — BP 128/81 | HR 79 | Temp 97.4°F | Ht 67.0 in | Wt 173.4 lb

## 2021-06-23 DIAGNOSIS — M50123 Cervical disc disorder at C6-C7 level with radiculopathy: Secondary | ICD-10-CM

## 2021-06-23 MED ORDER — GABAPENTIN 300 MG PO CAPS: 300.0000 mg | ORAL_CAPSULE | Freq: Two times a day (BID) | ORAL | 0 refills | Status: DC | PRN

## 2021-06-23 MED ORDER — HYDROCODONE-ACETAMINOPHEN 5-325 MG PO TABS: 1.0000 | ORAL_TABLET | Freq: Four times a day (QID) | ORAL | 0 refills | Status: DC | PRN

## 2021-06-23 NOTE — Progress Notes (Signed)
Assessment & Plan:  1. 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 Not to take Norco and gabapentin together. Only take Norco if absolutely necessary.  - HYDROcodone-acetaminophen (NORCO) 5-325 MG tablet; Take 1 tablet by mouth every 6 (six) hours as needed for moderate pain.  Dispense: 20 tablet; Refill: 0 - gabapentin (NEURONTIN) 300 MG capsule; Take 1 capsule (300 mg total) by mouth 2 (two) times daily as needed.  Dispense: 30 capsule; Refill: 0   Return for pain contract if he is going to need a refill of Norco.  [name], MSN, APRN, FNP-C [name]  Subjective:    Patient ID: Kevin [name], male    DOB: [birth year], 35 y.o.   MRN: [id number]  Patient Care Team: Gwenlyn Fudge, FNP as PCP - General (Family Medicine)   Chief Complaint:  Chief Complaint  Patient presents with   Shoulder Pain    HPI: Kevin [name] is a 35 y.o. male presenting on 06/23/2021 for Shoulder Pain  Patient is accompanied by his wife, who he is okay with being present.   Patient reports neck pain radiating down his right arm that started on Sunday after kayaking on Saturday and playing disc golf on Sunday. He was seen previously in our office (3 days ago) at which time he was treated with steroids and a muscle relaxer. He was then seen at [facility] yesterday during which time he had a MRI of the cervical spine that had the following impression: 1. At C6-7 there is large broad central disc protrusion contacting the ventral cervical spinal cord. No neural foraminal stenosis. No central canal stenosis.  2.  No acute osseous injury of the cervical spine.  His steroids were extended and he was advised to follow-up with a neurosurgeon at [facility]. He declined pain medication at that time.   Today he reports the pain seems to be getting worse and he is not sleeping due to  the pain. He rates the pain 9/10. He has not yet called [employer] because his boss' daughter is supposed to be speaking with some of her colleagues out at [employer].    Social history:  Relevant past medical, surgical, family and social history reviewed and updated as indicated. Interim medical history since our last visit reviewed.  Allergies and medications reviewed and updated.  DATA REVIEWED: CHART IN EPIC  ROS: Negative unless specifically indicated above in HPI.    Current Outpatient Medications:    cyclobenzaprine (FLEXERIL) 10 MG tablet, Take 1 tablet (10 mg total) by mouth 3 (three) times daily as needed for muscle spasms., Disp: 30 tablet, Rfl: 1   predniSONE (STERAPRED UNI-PAK 21 TAB) 10 MG (21) TBPK tablet, As directed x 6 days, Disp: 21 tablet, Rfl: 0   No Known Allergies Past Medical History:  Diagnosis Date   Arthritis    GERD (gastroesophageal reflux disease)     Past Surgical History:  Procedure Laterality Date   APPENDECTOMY     HERNIA REPAIR Left    x 2 inguinal   Wrist surgery       Social History   Socioeconomic History   Marital status: Married    Spouse name: Not on file   Number of children: Not on file   Years of education: Not on file   Highest education level: Not on file  Occupational History   Not on file  Tobacco Use  Smoking status: Every Day    Packs/day: 1.00    Years: 8.00    Pack years: 8.00    Types: Cigarettes   Smokeless tobacco: Current  Vaping Use   Vaping Use: Never used  Substance and Sexual Activity   Alcohol use: No   Drug use: No    Types: Marijuana   Sexual activity: Yes  Other Topics Concern   Not on file  Social History Narrative   Not on file   Social Determinants of Health   Financial Resource Strain: Not on file  Food Insecurity: Not on file  Transportation Needs: Not on file  Physical Activity: Not on file  Stress: Not on file  Social Connections: Not on file  Intimate Partner Violence: Not on file         Objective:    BP 128/81   Pulse 79   Temp (!) 97.4 F (36.3 C)   Ht 5\' 7"  (1.702 m)   Wt 173 lb 6.4 oz (78.7 kg)   SpO2 97%   BMI 27.16 kg/m   Wt Readings from Last 3 Encounters:  06/23/21 173 lb 6.4 oz (78.7 kg)  06/20/21 169 lb (76.7 kg)  01/20/21 177 lb (80.3 kg)    Physical Exam Vitals reviewed.  Constitutional:      General: He is not in acute distress.    Appearance: Normal appearance. He is not ill-appearing, toxic-appearing or diaphoretic.  HENT:     Head: Normocephalic and atraumatic.  Eyes:     General: No scleral icterus.       Right eye: No discharge.        Left eye: No discharge.     Conjunctiva/sclera: Conjunctivae normal.  Cardiovascular:     Rate and Rhythm: Normal rate.  Pulmonary:     Effort: Pulmonary effort is normal. No respiratory distress.  Musculoskeletal:        General: Normal range of motion.  Skin:    General: Skin is warm and dry.  Neurological:     Mental Status: He is alert and oriented to person, place, and time. Mental status is at baseline.  Psychiatric:        Mood and Affect: Mood normal.        Behavior: Behavior normal.        Thought Content: Thought content normal.        Judgment: Judgment normal.    No results found for: TSH Lab Results  Component Value Date   HGB 15.0 07/10/2011   HCT 44.0 07/10/2011   Lab Results  Component Value Date   NA 141 07/10/2011   K 4.6 07/10/2011   GLUCOSE 93 07/10/2011   BUN 15 07/10/2011   CREATININE 1.00 07/10/2011   No results found for: CHOL No results found for: HDL No results found for: LDLCALC No results found for: TRIG No results found for: CHOLHDL No results found for: 07/12/2011

## 2021-06-24 ENCOUNTER — Telehealth: Payer: Self-pay | Admitting: Family Medicine

## 2021-06-24 ENCOUNTER — Encounter: Payer: Self-pay | Admitting: Family Medicine

## 2021-06-24 DIAGNOSIS — M50123 Cervical disc disorder at C6-C7 level with radiculopathy: Secondary | ICD-10-CM

## 2021-06-24 DIAGNOSIS — M5126 Other intervertebral disc displacement, lumbar region: Secondary | ICD-10-CM

## 2021-06-24 NOTE — Telephone Encounter (Signed)
NOTED

## 2021-06-27 ENCOUNTER — Telehealth: Payer: Self-pay | Admitting: Family Medicine

## 2021-06-27 NOTE — Telephone Encounter (Signed)
Referral placed in MyChart encounter.

## 2021-06-27 NOTE — Telephone Encounter (Signed)
REFERRAL REQUEST Telephone Note  Have you been seen at our office for this problem? yes (Advise that they may need an appointment with their PCP before a referral can be done)  Reason for Referral: Neck. Can't go back to work until seen. Referral discussed with patient: YES Best contact number of patient for referral team:   [phone] Has patient been seen by a specialist for this issue before: NO Patient provider preference for referral: N/A Patient location preference for referral: [facility]   Patient notified that referrals can take up to a week or longer to process. If they haven't heard anything within a week they should call back and speak with the referral department.

## 2021-08-09 ENCOUNTER — Telehealth: Payer: Self-pay | Admitting: Family Medicine

## 2021-08-09 NOTE — Telephone Encounter (Signed)
FYI: Pt called to make sure we received lab order from [facility] for a Urine Nicotine test.  Called their office and got them to refax lab order. Lab order was received and given to lab.   Pt aware that we received order. Pt will come in on 9/19 to have labs done.  Pt needs this test completed before he can be approved to have spinal fusion, which he is scheduled to have done on 08/19/21.

## 2021-08-15 ENCOUNTER — Other Ambulatory Visit: Payer: 59

## 2021-08-15 ENCOUNTER — Other Ambulatory Visit: Payer: Self-pay

## 2021-08-17 ENCOUNTER — Telehealth: Payer: Self-pay | Admitting: Family Medicine

## 2021-08-17 NOTE — Telephone Encounter (Signed)
Attempted to contact patient  Our office did not order the lab work - patient needs to call neuro office

## 2021-08-24 ENCOUNTER — Other Ambulatory Visit: Payer: Self-pay | Admitting: Family Medicine

## 2021-08-24 NOTE — Telephone Encounter (Signed)
Unable to reach patient, encounter closed °

## 2021-08-30 ENCOUNTER — Telehealth: Payer: Self-pay | Admitting: Family Medicine

## 2021-08-30 ENCOUNTER — Encounter: Payer: Self-pay | Admitting: Family Medicine

## 2021-08-31 ENCOUNTER — Telehealth: Payer: Self-pay | Admitting: Family Medicine

## 2021-08-31 NOTE — Telephone Encounter (Signed)
Appointment scheduled.

## 2021-08-31 NOTE — Telephone Encounter (Signed)
Sooner appointment scheduled.

## 2021-08-31 NOTE — Telephone Encounter (Signed)
Pt called and made appt to see Britney for surgery clearance on 10/17 but pt says he really needs to be seen ASAP because he has already been waiting a few months to get everything else cleared for him to have surgery. Wants to know if there is any way Kevin [name] can work him in to be seen this Thursday or Friday?  Please advise and call patient.

## 2021-09-02 ENCOUNTER — Ambulatory Visit: Payer: 59 | Admitting: Family Medicine

## 2021-09-02 ENCOUNTER — Encounter: Payer: Self-pay | Admitting: Family Medicine

## 2021-09-02 ENCOUNTER — Other Ambulatory Visit: Payer: Self-pay

## 2021-09-02 VITALS — BP 126/83 | HR 82 | Temp 98.0°F | Ht 67.0 in | Wt 175.6 lb

## 2021-09-02 DIAGNOSIS — Z01818 Encounter for other preprocedural examination: Secondary | ICD-10-CM

## 2021-09-02 LAB — BAYER DCA HB A1C WAIVED: HB A1C (BAYER DCA - WAIVED): 5 % (ref 4.8–5.6)

## 2021-09-02 NOTE — Progress Notes (Signed)
Pt is a 35 y.o. male who is here for preoperative clearance for cervical fusion with Dr. Marcello Moores at [facility]. He is accompanied by his wife, who he is okay with being present.  1) High Risk Cardiac Conditions  1) Recent MI - No.  2) Decompensated Heart Failure - No.  3) Unstable angina - No.  4) Symptomatic arrythmia - No.  5) Sx Valvular Disease - No.  2) Intermediate Risk Factors - DM, CKD, CVA, CHF, CAD - No.  2) Functional Status - > 4 mets (Walk, run, climb stairs) Yes.  Kevin [name] Activity Status Index: 50.2/58.2.  3) Surgery Specific Risk - Intermediate   4) Further Noninvasive evaluation -   1) EKG - No.   1) Hx of CVA, CAD, DM, CKD  2) Echo - No.   1) Worsening dyspnea   3) Stress Testing - Active Cardiac Disease - No.  5) Need for medical therapy - Beta Blocker, Statins indicated ? No.  PE: Vitals:   09/02/21 1608  BP: 126/83  Pulse: 82  Temp: 98 F (36.7 C)  SpO2: 96%   Physical Exam Vitals reviewed.  Constitutional:      General: He is not in acute distress.    Appearance: Normal appearance. He is not ill-appearing, toxic-appearing or diaphoretic.  HENT:     Head: Normocephalic and atraumatic.     Right Ear: Tympanic membrane, ear canal and external ear normal. There is no impacted cerumen.     Left Ear: Tympanic membrane, ear canal and external ear normal. There is no impacted cerumen.     Nose: Nose normal. No congestion or rhinorrhea.     Mouth/Throat:     Mouth: Mucous membranes are moist.     Pharynx: Oropharynx is clear. No oropharyngeal exudate or posterior oropharyngeal erythema.  Eyes:     General: No scleral icterus.       Right eye: No discharge.        Left eye: No discharge.     Conjunctiva/sclera: Conjunctivae normal.     Pupils: Pupils are equal, round, and reactive to light.  Neck:     Vascular: No carotid bruit.  Cardiovascular:     Rate and Rhythm: Normal rate and regular rhythm.     Heart sounds: Normal heart  sounds. No murmur heard.   No friction rub. No gallop.  Pulmonary:     Effort: Pulmonary effort is normal. No respiratory distress.     Breath sounds: Normal breath sounds. No stridor. No wheezing, rhonchi or rales.  Abdominal:     General: Abdomen is flat. Bowel sounds are normal. There is no distension.     Palpations: Abdomen is soft. There is no hepatomegaly, splenomegaly or mass.     Tenderness: There is no abdominal tenderness. There is no guarding or rebound.     Hernia: No hernia is present.  Musculoskeletal:        General: Normal range of motion.     Cervical back: Normal range of motion and neck supple. No rigidity. No muscular tenderness.     Right lower leg: No edema.     Left lower leg: No edema.  Lymphadenopathy:     Cervical: No cervical adenopathy.  Skin:    General: Skin is warm and dry.     Capillary Refill: Capillary refill takes less than 2 seconds.  Neurological:     General: No focal deficit present.     Mental Status: He is  alert and oriented to person, place, and time. Mental status is at baseline.  Psychiatric:        Mood and Affect: Mood normal.        Behavior: Behavior normal.        Thought Content: Thought content normal.        Judgment: Judgment normal.   1. Pre-operative clearance - CBC with Differential/Platelet - CMP14+EGFR - Bayer DCA Hb A1c Waived - Lipid panel  I have independently evaluated patient.  Kevin [name] is a 35 y.o. male who is low risk for a intermediate risk surgery.  There are not modifiable risk factors (smoking, etc) (he recently quit smoking).  Kevin [name]'s RCRI/NSQIP calculation for MACE is: 0.    Hendricks Limes, MSN, APRN, FNP-C [place]

## 2021-09-03 LAB — CBC WITH DIFFERENTIAL/PLATELET
Basophils Absolute: 0 10*3/uL (ref 0.0–0.2)
Basos: 0 %
EOS (ABSOLUTE): 0.2 10*3/uL (ref 0.0–0.4)
Eos: 2 %
Hematocrit: 44.9 % (ref 37.5–51.0)
Hemoglobin: 15.3 g/dL (ref 13.0–17.7)
Immature Grans (Abs): 0 10*3/uL (ref 0.0–0.1)
Immature Granulocytes: 0 %
Lymphocytes Absolute: 2.8 10*3/uL (ref 0.7–3.1)
Lymphs: 37 %
MCH: 30.2 pg (ref 26.6–33.0)
MCHC: 34.1 g/dL (ref 31.5–35.7)
MCV: 89 fL (ref 79–97)
Monocytes Absolute: 0.6 10*3/uL (ref 0.1–0.9)
Monocytes: 8 %
Neutrophils Absolute: 3.9 10*3/uL (ref 1.4–7.0)
Neutrophils: 53 %
Platelets: 304 10*3/uL (ref 150–450)
RBC: 5.07 x10E6/uL (ref 4.14–5.80)
RDW: 12.5 % (ref 11.6–15.4)
WBC: 7.5 10*3/uL (ref 3.4–10.8)

## 2021-09-03 LAB — CMP14+EGFR
ALT: 26 IU/L (ref 0–44)
AST: 20 IU/L (ref 0–40)
Albumin/Globulin Ratio: 1.6 (ref 1.2–2.2)
Albumin: 4.4 g/dL (ref 4.0–5.0)
Alkaline Phosphatase: 60 IU/L (ref 44–121)
BUN/Creatinine Ratio: 15 (ref 9–20)
BUN: 16 mg/dL (ref 6–20)
Bilirubin Total: 0.3 mg/dL (ref 0.0–1.2)
CO2: 24 mmol/L (ref 20–29)
Calcium: 9 mg/dL (ref 8.7–10.2)
Chloride: 104 mmol/L (ref 96–106)
Creatinine, Ser: 1.06 mg/dL (ref 0.76–1.27)
Globulin, Total: 2.7 g/dL (ref 1.5–4.5)
Glucose: 81 mg/dL (ref 70–99)
Potassium: 4.5 mmol/L (ref 3.5–5.2)
Sodium: 140 mmol/L (ref 134–144)
Total Protein: 7.1 g/dL (ref 6.0–8.5)
eGFR: 93 mL/min/{1.73_m2} (ref >59)

## 2021-09-03 LAB — LIPID PANEL
Chol/HDL Ratio: 8.6 ratio — ABNORMAL HIGH (ref 0.0–5.0)
Cholesterol, Total: 224 mg/dL — ABNORMAL HIGH (ref 100–199)
HDL: 26 mg/dL — ABNORMAL LOW (ref >39)
LDL Chol Calc (NIH): 166 mg/dL — ABNORMAL HIGH (ref 0–99)
Triglycerides: 170 mg/dL — ABNORMAL HIGH (ref 0–149)
VLDL Cholesterol Cal: 32 mg/dL (ref 5–40)

## 2021-09-04 ENCOUNTER — Encounter: Payer: Self-pay | Admitting: Family Medicine

## 2021-09-04 DIAGNOSIS — E785 Hyperlipidemia, unspecified: Secondary | ICD-10-CM

## 2021-09-04 HISTORY — DX: Hyperlipidemia, unspecified: E78.5

## 2021-09-12 ENCOUNTER — Ambulatory Visit: Payer: 59 | Admitting: Family Medicine

## 2021-10-11 ENCOUNTER — Ambulatory Visit: Payer: 59

## 2021-10-24 ENCOUNTER — Ambulatory Visit: Payer: 59

## 2021-10-26 ENCOUNTER — Encounter: Payer: Self-pay | Admitting: Physical Therapy

## 2021-10-26 ENCOUNTER — Other Ambulatory Visit: Payer: Self-pay

## 2021-10-26 ENCOUNTER — Ambulatory Visit: Payer: 59 | Attending: Neurosurgery | Admitting: Physical Therapy

## 2021-10-26 DIAGNOSIS — M542 Cervicalgia: Secondary | ICD-10-CM | POA: Insufficient documentation

## 2021-10-26 NOTE — Therapy (Signed)
[facility] [address] [place], [place], [address] Phone: [phone]   Fax:  [phone]  Physical Therapy Evaluation  Patient Details  Name: Kevin [name] MRN: [id number] Date of Birth: August 15, 1986 Referring Provider (PT): Coy Saunas. Maisie Fus MD   Encounter Date: 10/26/2021   PT End of Session - 10/26/21 1533     Visit Number 1    Number of Visits 6    Date for PT Re-Evaluation 12/07/21    PT Start Time 0114    PT Stop Time 0146    PT Time Calculation (min) 32 min    Behavior During Therapy [facility] for tasks assessed/performed             Past Medical History:  Diagnosis Date   Arthritis    GERD (gastroesophageal reflux disease)    Hyperlipidemia 09/04/2021    Past Surgical History:  Procedure Laterality Date   APPENDECTOMY     HERNIA REPAIR Left    x 2 inguinal   Wrist surgery       There were no vitals filed for this visit.    Subjective Assessment - 10/26/21 1351     Subjective COVID-19 screen performed prior to patient entering clinic.  The patient presents to the clinic today with c/o right sided neck pain that resulted in numbness over her right UE.  He states his pain and symptoms began toward the end of July of this year.  He had kayaked and played disc golf.  The pain was so severe that he presented to an ED on 06/22/21.  At rest, today his pain is a 4/10 but can commonly rise to an 8/10.  He reports that his right UE will go numb in the middle of the night and his sleep is disturbed.    Pertinent History H/o low back pain, right wrist injury and subsquent surgeries, hernia repair x 3.    Diagnostic tests MRI:  Large C6-6 disc herniation.    Patient Stated Goals The patient would like to be out of pain even if that should require surgery.  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    Currently in Pain? Yes    Pain Score 4     Pain Location Neck   Right UE.   Pain  Orientation Right    Pain Descriptors / Indicators Aching;Throbbing;Shooting;Sharp;Numbness    Pain Type Acute pain    Pain Onset More than a month ago    Pain Frequency Constant    Aggravating Factors  "Various things."    Pain Relieving Factors "Nothing."                [facility] PT Assessment - 10/26/21 0001       Assessment   Medical Diagnosis Stenosis of cervical spine with myleopathy.    Referring Provider (PT) Coy Saunas. Maisie Fus MD    Onset Date/Surgical Date --   July 2022.     Precautions   Precautions None      Restrictions   Weight Bearing Restrictions No      Balance Screen   Has the patient fallen in the past 6 months No    Has the patient had a decrease in activity level because of a fear of falling?  No    Is the patient reluctant to leave their home because of a fear of falling?  No      Home Tourist information centre manager residence  Prior Function   Level of Independence Independent      Posture/Postural Control   Posture/Postural Control No significant limitations      Deep Tendon Reflexes   DTR Assessment Site Biceps;Brachioradialis;Triceps    Biceps DTR --   Right is absent, LT 1+/4+.   Brachioradialis DTR 1+    Triceps DTR 1+      ROM / Strength   AROM / PROM / Strength Strength;AROM      AROM   Overall AROM Comments Bilateral active cervical rotation is 75 degrees and bilateral sidebending is 25 degrees.      Strength   Overall Strength Comments Normal UE strength.      Palpation   Palpation comment No c/o palpable pain though his right UT region was remarkable for increased tone.                        Objective measurements completed on examination: See above findings.                     PT Long Term Goals - 10/26/21 1549       PT LONG TERM GOAL #1   Title Independent with an HEP.    Time 6    Period Weeks    Status New      PT LONG TERM GOAL #2   Title Perform ADL's/work  activities with neck pain not > 3/10.    Time 6    Period Weeks    Status New      PT LONG TERM GOAL #3   Title Sleep undisturbed 6 hours.    Time 6    Period Weeks    Status New      PT LONG TERM GOAL #4   Title Eliminate right UE symptoms.    Time 6    Period Weeks    Status New                    Plan - 10/26/21 1541     Clinical Impression Statement The patient presents to OPPT with c/o right-sided neck pain and occasions when his right UE goes numb. He reports a time when he was unable to use the fingers of his right hand for some time.  He cannot sleep comfortably due to pain and numbness of his right UE.  An MRI demonstrates a large disc herniation at C6-7.  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  His right Biceps deep tendon reflex is absent.    Personal Factors and Comorbidities Other    Examination-Activity Limitations Other    Stability/Clinical Decision Making Evolving/Moderate complexity    Clinical Decision Making Low    Rehab Potential --   Poor-Fair-   PT Frequency 1x / week    PT Duration 6 weeks    PT Treatment/Interventions ADLs/Self Care Home Management;Electrical Stimulation;Ultrasound;Moist Heat;Therapeutic activities;Therapeutic exercise;Manual techniques;Patient/family education;Passive range of motion;Dry needling    PT Next Visit Plan Combo e'stim/US to right lower cervical region, STW/M, manual traction, Mckenzie neck exercises.    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 [place], [place], [address] Phone: [phone]   Fax:  [phone]  Name: Kevin Alan Agostinelli MRN: [id number] Date of Birth: Aug 20, 1986

## 2021-11-03 ENCOUNTER — Ambulatory Visit: Payer: 59 | Attending: Neurosurgery | Admitting: *Deleted

## 2022-06-15 ENCOUNTER — Encounter: Payer: Self-pay | Admitting: Emergency Medicine

## 2022-06-15 ENCOUNTER — Ambulatory Visit
Admission: EM | Admit: 2022-06-15 | Discharge: 2022-06-15 | Disposition: A | Discharge status: Home / Self Care | Payer: 59 | Attending: Emergency Medicine | Admitting: Emergency Medicine

## 2022-06-15 DIAGNOSIS — T6391XA Toxic effect of contact with unspecified venomous animal, accidental (unintentional), initial encounter: Secondary | ICD-10-CM | POA: Diagnosis not present

## 2022-06-15 DIAGNOSIS — Z23 Encounter for immunization: Secondary | ICD-10-CM

## 2022-06-15 MED ORDER — SULFAMETHOXAZOLE-TRIMETHOPRIM 800-160 MG PO TABS: 1.0000 | ORAL_TABLET | Freq: Two times a day (BID) | ORAL | 0 refills | Status: AC

## 2022-06-15 MED ORDER — TETANUS-DIPHTH-ACELL PERTUSSIS 5-2.5-18.5 LF-MCG/0.5 IM SUSY
0.5000 mL | PREFILLED_SYRINGE | Freq: Once | INTRAMUSCULAR | Status: AC
  Administered 2022-06-15: 0.5 mL via INTRAMUSCULAR

## 2022-06-15 MED ORDER — CHLORHEXIDINE GLUCONATE 4 % EX LIQD: CUTANEOUS | 2 refills | Status: DC

## 2022-06-15 NOTE — Discharge Instructions (Signed)
Please begin Bactrim for coverage of superficial infection secondary to insect bite that you likely received when you are in the woods.  The biggest concern is that this is a brown recluse bite.  Brown likely spider bites are often unnoticed because they are not painful and the spider is small and well camouflaged by its light brown color.  If you are willing, I do recommend that you shave your beard and keep the area surrounding the wound clean and dry as much as possible.    I have provided you with a prescription for Hibiclens, a strong antibacterial body wash that I would like for you to use with a sterile gauze pad to clean the area twice daily.  Moisten the gauze with a Hibiclens, wash the area and then pat dry with another piece of sterile gauze.    Keep the wound covered as much as you can, okay to remove bandage when showering.  Monitor the wound for signs of worsening infection which would be swelling, redness, purulent drainage or foul odor.  Please follow-up with your primary care provider or with [place] here urgent care as often as needed if you need to have the wound reevaluated.  Thank you for visiting urgent care today.

## 2022-06-15 NOTE — ED Triage Notes (Signed)
Pt here with a small circular rash that is painful to the base of his neck since today. Does not recall getting bit by anything.

## 2022-06-15 NOTE — ED Provider Notes (Signed)
[facility]    CSN: [id number] Arrival date & time: 06/15/22  1053    HISTORY   Chief Complaint  Patient presents with   Rash   HPI Kevin [name] is a pleasant, 36 y.o. male who presents to urgent care today. Patient presents to urgent care complaining of a 1 cm well demarcated circular lesion with small black spot in the center at the base of the front of his neck at his sternal notch which he noticed this morning.  Patient states the lesion is surrounded by a little bit of redness as well.  Patient states that being in the sun makes it hurt so he wore a shirt high enough to cover it.  Patient states he was hiking a few days ago in the woods, does not recall being bitten by anything.  Patient states he did hike through a lot of brush.  Patient states he does spend little time outside.  Patient states he also plays ultimate Frisbee outdoors several times a week.  Patient states he has not tried any remedies as of yet.  Patient states he has never had a lesion like this before.  The history is provided by the patient.   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  Authorizing Provider  chlorhexidine (HIBICLENS) 4 % external liquid Use twice daily with sterile gauze to wash affected area until wound is resolved. 06/15/22  Yes Lynden Oxford Scales, PA-C  sulfamethoxazole-trimethoprim (BACTRIM DS) 800-160 MG tablet Take 1 tablet by mouth 2 (two) times daily for 7 days. 06/15/22 06/22/22 Yes Lynden Oxford Scales, PA-C  cyclobenzaprine (FLEXERIL) 10 MG tablet Take 1 tablet (10 mg total) by mouth 3 (three) times daily as needed for muscle spasms. 06/20/21   Hassell Done, Mary-Margaret, FNP  gabapentin (NEURONTIN) 300 MG capsule Take 1 capsule (300 mg total) by mouth 2 (two) times daily as needed. 06/23/21   Loman Brooklyn, FNP  HYDROcodone-acetaminophen (NORCO) 5-325 MG tablet Take 1 tablet by mouth every 6 (six) hours as needed for moderate pain. 06/23/21   Loman Brooklyn, FNP    Family History Family History  Problem Relation Age of Onset   Diabetes Mother    COPD Mother    Heart attack Maternal Grandmother    Heart disease Maternal Grandmother    Breast cancer Neg Hx    Prostate cancer Neg Hx    Ovarian cancer Neg Hx    Social History Social History   Tobacco Use   Smoking status: Every Day    Packs/day: 1.00    Years: 8.00    Total pack years: 8.00  Types: Cigarettes   Smokeless tobacco: Current  Vaping Use   Vaping Use: Never used  Substance Use Topics   Alcohol use: No   Drug use: No    Types: Marijuana   Allergies   Patient has no known allergies.  Review of Systems Review of Systems Pertinent findings revealed after performing a 14 point review of systems has been noted in the history of present illness.  Physical Exam Triage Vital Signs ED Triage Vitals  Enc Vitals Group     BP 09/23/21 0827 (!) 147/82     Pulse Rate 09/23/21 0827 72     Resp 09/23/21 0827 18     Temp 09/23/21 0827 98.3 F (36.8 C)     Temp Source 09/23/21 0827 Oral     SpO2 09/23/21 0827 98 %     Weight --      Height --      Head Circumference --       Peak Flow --      Pain Score 09/23/21 0826 5     Pain Loc --      Pain Edu? --      Excl. in GC? --   No data found.  Updated Vital Signs BP 136/86   Pulse 87   Temp 98.2 F (36.8 C)   Resp 20   SpO2 97%   Physical Exam Vitals and nursing note reviewed.  Constitutional:      General: He is not in acute distress.    Appearance: Normal appearance. He is normal weight. He is not ill-appearing.  HENT:     Head: Normocephalic and atraumatic.  Eyes:     Extraocular Movements: Extraocular movements intact.     Conjunctiva/sclera: Conjunctivae normal.     Pupils: Pupils are equal, round, and reactive to light.  Cardiovascular:     Rate and Rhythm: Normal rate and regular rhythm.  Pulmonary:     Effort: Pulmonary effort is normal.     Breath sounds: Normal breath sounds.  Musculoskeletal:        General: Normal range of motion.     Cervical back: Normal range of motion and neck supple.  Skin:    General: Skin is warm and dry.     Findings: Lesion (Please see photo below) present.  Neurological:     General: No focal deficit present.     Mental Status: He is alert and oriented to person, place, and time. Mental status is at baseline.  Psychiatric:        Mood and Affect: Mood normal.        Behavior: Behavior normal.        Thought Content: Thought content normal.        Judgment: Judgment normal.      Visual Acuity Right Eye Distance:   Left Eye Distance:   Bilateral Distance:    Right Eye Near:   Left Eye Near:    Bilateral Near:     UC Couse / Diagnostics / Procedures:     Radiology No results found.  Procedures Procedures (including critical care time) EKG  Pending results:  Labs Reviewed - No data to display  Medications Ordered in UC: Medications  Tdap (BOOSTRIX) injection 0.5 mL (has no administration in time range)    UC Diagnoses / Final Clinical Impressions(s)   I have reviewed the triage vital signs and the nursing notes.  Pertinent  labs & imaging results that were available during my care of the  patient were reviewed by me and considered in my medical decision making (see chart for details).    Final diagnoses:  Venomous bite, accidental or unintentional, initial encounter   Lesion is concerning for event of a spider bite such as brown recluse.  Patient advised of the importance of monitoring the wound for signs of necrosis and skin breakdown, penetration.  Patient states he has been millimeter with the progression of brown recluse spider bites having seen a friend go through it.  Patient provided with a prescription for Bactrim for empiric coverage of any superficial bacterial infection in the wound.  Patient also provided with a prescription for Hibiclens to clean twice daily.  Patient was agreeable to shaving his beard at this time.  Patient also advised to quit smoking so that his skin receives as much oxygenation as possible to promote wound healing.  Return precautions advised.  ED Prescriptions     Medication Sig Dispense Auth. Provider   sulfamethoxazole-trimethoprim (BACTRIM DS) 800-160 MG tablet Take 1 tablet by mouth 2 (two) times daily for 7 days. 14 tablet Theadora Rama Scales, PA-C   chlorhexidine (HIBICLENS) 4 % external liquid Use twice daily with sterile gauze to wash affected area until wound is resolved. 236 mL Theadora Rama Scales, PA-C      PDMP not reviewed this encounter.  Pending results:  Labs Reviewed - No data to display  Discharge Instructions:   Discharge Instructions      Please begin Bactrim for coverage of superficial infection secondary to insect bite that you likely received when you are in the woods.  The biggest concern is that this is a brown recluse bite.  Brown likely spider bites are often unnoticed because they are not painful and the spider is small and well camouflaged by its light brown color.  If you are willing, I do recommend that you shave your beard and keep the  area surrounding the wound clean and dry as much as possible.    I have provided you with a prescription for Hibiclens, a strong antibacterial body wash that I would like for you to use with a sterile gauze pad to clean the area twice daily.  Moisten the gauze with a Hibiclens, wash the area and then pat dry with another piece of sterile gauze.    Keep the wound covered as much as you can, okay to remove bandage when showering.  Monitor the wound for signs of worsening infection which would be swelling, redness, purulent drainage or foul odor.  Please follow-up with your primary care provider or with [place] here urgent care as often as needed if you need to have the wound reevaluated.  Thank you for visiting urgent care today.    Disposition Upon Discharge:  Condition: stable for discharge home  Patient presented with an acute illness with associated systemic symptoms and significant discomfort requiring urgent management. In my opinion, this is a condition that a prudent lay person (someone who possesses an average knowledge of health and medicine) may potentially expect to result in complications if not addressed urgently such as respiratory distress, impairment of bodily function or dysfunction of bodily organs.   Routine symptom specific, illness specific and/or disease specific instructions were discussed with the patient and/or caregiver at length.   As such, the patient has been evaluated and assessed, work-up was performed and treatment was provided in alignment with urgent care protocols and evidence based medicine.  Patient/parent/caregiver has been advised that the patient may require follow  up for further testing and treatment if the symptoms continue in spite of treatment, as clinically indicated and appropriate.  Patient/parent/caregiver has been advised to return to the [facility] or PCP if no better; to PCP or the Emergency Department if new signs and symptoms develop, or if the current  signs or symptoms continue to change or worsen for further workup, evaluation and treatment as clinically indicated and appropriate  The patient will follow up with their current PCP if and as advised. If the patient does not currently have a PCP we will assist them in obtaining one.   The patient may need specialty follow up if the symptoms continue, in spite of conservative treatment and management, for further workup, evaluation, consultation and treatment as clinically indicated and appropriate.   Patient/parent/caregiver verbalized understanding and agreement of plan as discussed.  All questions were addressed during visit.  Please see discharge instructions below for further details of plan.  This office note has been dictated using Museum/gallery curator.  Unfortunately, this method of dictation can sometimes lead to typographical or grammatical errors.  I apologize for your inconvenience in advance if this occurs.  Please do not hesitate to reach out to me if clarification is needed.      Lynden Oxford Scales, PA-C 06/15/22 1142

## 2022-09-07 ENCOUNTER — Encounter: Payer: Self-pay | Admitting: Family Medicine

## 2022-09-07 ENCOUNTER — Ambulatory Visit (INDEPENDENT_AMBULATORY_CARE_PROVIDER_SITE_OTHER): Payer: BC Managed Care – PPO

## 2022-09-07 ENCOUNTER — Ambulatory Visit (INDEPENDENT_AMBULATORY_CARE_PROVIDER_SITE_OTHER): Payer: BC Managed Care – PPO | Admitting: Family Medicine

## 2022-09-07 VITALS — BP 115/75 | HR 78 | Temp 98.4°F | Ht 67.0 in | Wt 172.4 lb

## 2022-09-07 DIAGNOSIS — R059 Cough, unspecified: Secondary | ICD-10-CM | POA: Diagnosis not present

## 2022-09-07 DIAGNOSIS — R051 Acute cough: Secondary | ICD-10-CM

## 2022-09-07 DIAGNOSIS — R0602 Shortness of breath: Secondary | ICD-10-CM | POA: Diagnosis not present

## 2022-09-07 NOTE — Progress Notes (Signed)
Subjective:  Patient ID: Kevin [name], male    DOB: August 30, 1986  Age: 36 y.o. MRN: [id number]  CC: Cough and Nasal Congestion   HPI Kevin [name] presents for  four days of cough. Dry. Two negative Covid ests. Getting harder to breathe.Deep breath brings on deep cough. Right side hurts with the cough. No fever. HA this AM. Using ibuprofen for HA and central chest pain.      09/07/2022    2:20 PM 09/02/2021    4:14 PM 06/23/2021    3:38 PM  Depression screen PHQ 2/9  Decreased Interest 0 0 0  Down, Depressed, Hopeless 1 0 0  PHQ - 2 Score 1 0 0  Altered sleeping 0 3 1  Tired, decreased energy 1 0 0  Change in appetite 0 0 0  Feeling bad or failure about yourself  0 0 0  Trouble concentrating 0 1 1  Moving slowly or fidgety/restless 1 0 0  Suicidal thoughts 0 0 0  PHQ-9 Score 3 4 2   Difficult doing work/chores Somewhat difficult Somewhat difficult Not difficult at all    History Kevin has a past medical history of Arthritis, GERD (gastroesophageal reflux disease), and Hyperlipidemia (09/04/2021).   He has a past surgical history that includes Appendectomy; Wrist surgery ; and Hernia repair (Left).   His family history includes COPD in his mother; Diabetes in his mother; Heart attack in his maternal grandmother; Heart disease in his maternal grandmother.He reports that he has been smoking cigarettes. He has a 8.00 pack-year smoking history. He uses smokeless tobacco. He reports that he does not drink alcohol and does not use drugs.    ROS Review of Systems  Constitutional:  Negative for fever.  Respiratory:  Positive for cough and chest tightness. Negative for shortness of breath.   Cardiovascular:  Positive for chest pain (central, with cough).  Musculoskeletal:  Negative for arthralgias.  Skin:  Negative for rash.    Objective:  BP 115/75   Pulse 78   Temp 98.4 F (36.9 C)   Ht 5\' 7"  (1.702 m)   Wt 172 lb 6.4 oz (78.2 kg)   SpO2 96%   BMI 27.00 kg/m   BP  Readings from Last 3 Encounters:  09/07/22 115/75  06/15/22 136/86  09/02/21 126/83    Wt Readings from Last 3 Encounters:  09/07/22 172 lb 6.4 oz (78.2 kg)  09/02/21 175 lb 9.6 oz (79.7 kg)  06/23/21 173 lb 6.4 oz (78.7 kg)     Physical Exam Constitutional:      Appearance: He is well-developed.  HENT:     Head: Normocephalic and atraumatic.     Right Ear: Tympanic membrane and external ear normal. No decreased hearing noted.     Left Ear: Tympanic membrane and external ear normal. No decreased hearing noted.     Nose: Mucosal edema present.     Right Sinus: No frontal sinus tenderness.     Left Sinus: No frontal sinus tenderness.     Mouth/Throat:     Pharynx: No oropharyngeal exudate or posterior oropharyngeal erythema.  Neck:     Meningeal: Brudzinski's sign absent.  Pulmonary:     Effort: No respiratory distress.     Breath sounds: Rhonchi and rales present.  Lymphadenopathy:     Head:     Right side of head: No preauricular adenopathy.     Left side of head: No preauricular adenopathy.     Cervical:     Right  cervical: No superficial cervical adenopathy.    Left cervical: No superficial cervical adenopathy.       Assessment & Plan:   Kevin was seen today for cough and nasal congestion.  Diagnoses and all orders for this visit:  Acute cough -     DG Chest 2 View; Future  Other orders -     levofloxacin (LEVAQUIN) 500 MG tablet; Take 1 tablet (500 mg total) by mouth daily. For 10 days       I have discontinued Kevin [name]'s HYDROcodone-acetaminophen. I am also having him start on levofloxacin. Additionally, I am having him maintain his cyclobenzaprine, gabapentin, and chlorhexidine.  Allergies as of 09/07/2022   No Known Allergies      Medication List        Accurate as of September 07, 2022 11:59 PM. If you have any questions, ask your nurse or doctor.          STOP taking these medications    HYDROcodone-acetaminophen 5-325 MG  tablet Commonly known as: Norco Stopped by: Claretta Fraise, MD       TAKE these medications    chlorhexidine 4 % external liquid Commonly known as: Hibiclens Use twice daily with sterile gauze to wash affected area until wound is resolved.   cyclobenzaprine 10 MG tablet Commonly known as: FLEXERIL Take 1 tablet (10 mg total) by mouth 3 (three) times daily as needed for muscle spasms.   gabapentin 300 MG capsule Commonly known as: Neurontin Take 1 capsule (300 mg total) by mouth 2 (two) times daily as needed.   levofloxacin 500 MG tablet Commonly known as: LEVAQUIN Take 1 tablet (500 mg total) by mouth daily. For 10 days Started by: Claretta Fraise, MD         Follow-up: Return if symptoms worsen or fail to improve.  Claretta Fraise, M.D.

## 2022-09-08 ENCOUNTER — Telehealth: Payer: Self-pay | Admitting: *Deleted

## 2022-09-08 ENCOUNTER — Encounter: Payer: Self-pay | Admitting: Family Medicine

## 2022-09-08 MED ORDER — LEVOFLOXACIN 500 MG PO TABS: 500.0000 mg | ORAL_TABLET | Freq: Every day | ORAL | 0 refills | Status: DC

## 2022-09-08 NOTE — Telephone Encounter (Signed)
Unfortunately, I have no clue what the plan is.

## 2022-09-08 NOTE — Telephone Encounter (Signed)
I took care of this and called the patient.Sorry I left you hanging. WS

## 2022-09-08 NOTE — Telephone Encounter (Signed)
Message from [facility].  Call from [facility].  Patient states he was supposed to get an antibiotic.  Note appears to be incomplete.  No mention of ABX in note.

## 2022-10-09 DIAGNOSIS — Z043 Encounter for examination and observation following other accident: Secondary | ICD-10-CM | POA: Diagnosis not present

## 2022-10-09 DIAGNOSIS — S97101A Crushing injury of unspecified right toe(s), initial encounter: Secondary | ICD-10-CM | POA: Diagnosis not present

## 2022-10-09 DIAGNOSIS — S97111A Crushing injury of right great toe, initial encounter: Secondary | ICD-10-CM | POA: Diagnosis not present

## 2022-10-09 DIAGNOSIS — T1490XA Injury, unspecified, initial encounter: Secondary | ICD-10-CM | POA: Diagnosis not present

## 2023-06-08 ENCOUNTER — Ambulatory Visit: Payer: 59 | Admitting: Family

## 2023-06-08 ENCOUNTER — Encounter: Payer: Self-pay | Admitting: Family

## 2023-06-08 VITALS — BP 112/81 | HR 84 | Temp 97.7°F | Wt 171.4 lb

## 2023-06-08 DIAGNOSIS — M7712 Lateral epicondylitis, left elbow: Secondary | ICD-10-CM | POA: Diagnosis not present

## 2023-06-08 MED ORDER — DICLOFENAC SODIUM 75 MG PO TBEC: 75.0000 mg | DELAYED_RELEASE_TABLET | Freq: Two times a day (BID) | ORAL | 0 refills | Status: DC

## 2023-06-08 MED ORDER — PREDNISONE 10 MG (21) PO TBPK: ORAL_TABLET | ORAL | 0 refills | Status: DC

## 2023-06-08 NOTE — Patient Instructions (Addendum)
Tennis Elbow  Tennis elbow (lateral epicondylitis) is inflammation of tendons in your outer forearm, near your elbow. Tendons are tissues that connect muscle to bone. When you have tennis elbow, inflammation affects the tendons that you use to bend your wrist and move your hand up. Inflammation occurs in the lower part of the upper arm bone (humerus), where the tendons connect to the bone (lateral epicondyle). Tennis elbow often affects people who play tennis, but anyone may get the condition from repeatedly extending the wrist or turning the forearm. What are the causes? This condition is usually caused by repeatedly extending the wrist, turning the forearm, and using the hands. It can result from sports or work that requires repetitive forearm movements. In some cases, it may be caused by a sudden injury. What increases the risk? You are more likely to develop tennis elbow if you play tennis or another racket sport. You also have a higher risk if you frequently use your hands for work. Besides people who play tennis, others at greater risk include: People who use computers. Holiday representative workers. People who work in [employer]. Musicians. Cooks. Cashiers. What are the signs or symptoms? Symptoms of this condition include: Pain and tenderness in the forearm and the outer part of the elbow. Pain may be felt only when using the arm, or it may be there all the time. A burning feeling that starts in the elbow and spreads down the forearm. A weak grip in the hand. How is this diagnosed? This condition is diagnosed based on your symptoms, your medical history, and a physical exam. You may also have X-rays or an MRI to: Confirm the diagnosis. Look for other issues. Check for tears in the ligaments, muscles, or tendons. How is this treated? Resting and icing your arm is often the first treatment. Your health care provider may also recommend: Medicines to reduce pain and inflammation. These may be in  the form of a pill, topical gels, or shots of a steroid medicine (cortisone). An elbow strap to reduce stress on the area. Physical therapy. This may include massage or exercises or both. An elbow brace to restrict the movements that cause symptoms. If these treatments do not help relieve your symptoms, your health care provider may recommend surgery to remove damaged muscle and reattach healthy muscle to bone. Follow these instructions at home: If you have a brace or strap: Wear the brace or strap as told by your health care provider. Remove it only as told by your health care provider. Check the skin around the brace or strap every day. Tell your health care provider about any concerns. Loosen the brace if your fingers tingle, become numb, or turn cold and blue. Keep the brace clean. If the brace or strap is not waterproof: Do not let it get wet. Cover it with a watertight covering when you take a bath or a shower. Managing pain, stiffness, and swelling  If directed, put ice on the injured area. To do this: If you have a removable brace or strap, remove it as told by your health care provider. Put ice in a plastic bag. Place a towel between your skin and the bag. Leave the ice on for 20 minutes, 2-3 times a day. Remove the ice if your skin turns bright red. This is very important. If you cannot feel pain, heat, or cold, you have a greater risk of damage to the area. Move your fingers often to reduce stiffness and swelling. Activity Rest your elbow  and wrist and avoid activities that cause symptoms as told by your health care provider. Do physical therapy exercises as told by your health care provider. If you lift an object, lift it with your palm facing up. This reduces stress on your elbow. Lifestyle If your tennis elbow is caused by sports, check your equipment and make sure that: You use it correctly. It is good match for you. If your tennis elbow is caused by work or computer  use, take frequent breaks to stretch your arm. Talk with your employer about ways to manage your condition at work. General instructions Take over-the-counter and prescription medicines only as told by your health care provider. Do not use any products that contain nicotine or tobacco. These products include cigarettes, chewing tobacco, and vaping devices, such as e-cigarettes. If you need help quitting, ask your health care provider. Keep all follow-up visits. This is important. How is this prevented? Before and after activity: Warm up and stretch before being active. Cool down and stretch after being active. Give your body time to rest between periods of activity. During activity: Make sure to use equipment that fits you. If you play tennis, put power in your stroke with your lower body. Avoid using your arm only. Maintain physical fitness, including: Strength. Flexibility. Endurance. Do exercises to strengthen the forearm muscles. Contact a health care provider if: You have pain that gets worse or does not get better with treatment. You have numbness or weakness in your forearm, hand, or fingers. Get help right away if: Your pain is severe. You cannot move your wrist. Summary Tennis elbow (lateral epicondylitis) is inflammation of tendons in your outer forearm, near your elbow. Common symptoms include pain and tenderness in your forearm and the outer part of your elbow. This condition is usually caused by repeatedly extending your wrist, turning your forearm, and using your hands. The first treatment is often resting and icing your arm to relieve symptoms. Further treatment may include taking medicine, getting physical therapy, wearing a brace or strap, or having surgery. This information is not intended to replace advice given to you by your health care provider. Make sure you discuss any questions you have with your health care provider.   Tennis Elbow Rehab Ask your health care  provider which exercises are safe for you. Do exercises exactly as told by your health care provider and adjust them as directed. It is normal to feel mild stretching, pulling, tightness, or discomfort as you do these exercises. Stop right away if you feel sudden pain or your pain gets worse. Do not begin these exercises until told by your health care provider. Stretching and range-of-motion exercises These exercises warm up your muscles and joints and improve the movement and flexibility of your elbow. Wrist flexion, assisted  Straighten your left / right elbow in front of you with your palm facing down toward the floor. If told by your health care provider, bend your left / right elbow to a 90-degree angle (right angle) at your side instead of holding it straight. With your other hand, gently push over the back of your left / right hand so your fingers point toward the floor (flexion). Stop when you feel a gentle stretch on the back of your forearm. Hold this position for __________ seconds. Repeat __________ times. Complete this exercise __________ times a day. Wrist extension, assisted  Straighten your left / right elbow in front of you with your palm facing up toward the ceiling. If told  by your health care provider, bend your left / right elbow to a 90-degree angle (right angle) at your side instead of holding it straight. With your other hand, gently pull your left / right hand and fingers toward the floor (extension). Stop when you feel a gentle stretch on the palm side of your forearm. Hold this position for __________ seconds. Repeat __________ times. Complete this exercise __________ times a day. Assisted forearm rotation, supination Sit or stand with your elbows at your side. Bend your left / right elbow to a 90-degree angle (right angle). Using your uninjured hand, turn your left / right palm up toward the ceiling (supination) until you feel a gentle stretch along the inside of your  forearm. Hold this position for __________ seconds. Repeat __________ times. Complete this exercise __________ times a day. Assisted forearm rotation, pronation Sit or stand with your elbows at your side. Bend your left / right elbow to a 90-degree angle (right angle). Using your uninjured hand, turn your left / right palm down toward the floor (pronation) until you feel a gentle stretch along the outside of your forearm. Hold this position for __________ seconds. Repeat __________ times. Complete this exercise __________ times a day. Strengthening exercises These exercises build strength and endurance in your forearm and elbow. Endurance is the ability to use your muscles for a long time, even after they get tired. Radial deviation  Stand with a __________ weight or a hammer in your left / right hand. Or, sit while holding a rubber exercise band or tubing, with your left / right forearm supported on a table or countertop. Position your forearm so that the thumb is facing the ceiling, as if you are going to clap your hands. This is the neutral position. Raise your hand upward in front of you so your thumb moves toward the ceiling (radial deviation), or pull up on the rubber tubing. Keep your forearm and elbow still while you move your wrist only. Hold this position for __________ seconds. Slowly return to the starting position. Repeat __________ times. Complete this exercise __________ times a day. Wrist extension, eccentric Sit with your left / right forearm palm-down and supported on a table or other surface. Let your left / right wrist extend over the edge of the surface. Hold a __________ weight or a piece of exercise band or tubing in your left / right hand. If using a rubber exercise band or tubing, hold the other end of the tubing with your other hand. Use your uninjured hand to move your left / right hand up toward the ceiling. Take your uninjured hand away and slowly return to the  starting position using only your left / right hand. Lowering your arm under tension is called eccentric extension. Repeat __________ times. Complete this exercise __________ times a day. Wrist extension Do not do this exercise if it causes pain at the outside of your elbow. Only do this exercise once instructed by your health care provider. Sit with your left / right forearm supported on a table or other surface and your palm turned down toward the floor. Let your left / right wrist extend over the edge of the surface. Hold a __________ weight or a piece of rubber exercise band or tubing. If you are using a rubber exercise band or tubing, hold the band or tubing in place with your other hand to provide resistance. Slowly bend your wrist so your hand moves up toward the ceiling (extension). Move only your  wrist, keeping your forearm and elbow still. Hold this position for __________ seconds. Slowly return to the starting position. Repeat __________ times. Complete this exercise __________ times a day. Forearm rotation, supination To do this exercise, you will need a lightweight hammer or rubber mallet. Sit with your left / right forearm supported on a table or other surface. Bend your elbow to a 90-degree angle (right angle). Position your forearm so that your palm is facing down toward the floor, with your hand resting over the edge of the table. Hold a hammer in your left / right hand. To make this exercise easier, hold the hammer near the head of the hammer. To make this exercise harder, hold the hammer near the end of the handle. Without moving your wrist or elbow, slowly rotate your forearm so your palm faces up toward the ceiling (supination). Hold this position for __________ seconds. Slowly return to the starting position. Repeat __________ times. Complete this exercise __________ times a day. Shoulder blade squeeze Sit in a stable chair or stand with good posture. If you are sitting  down, do not let your back touch the back of the chair. Your arms should be at your sides with your elbows bent to a 90-degree angle (right angle). Position your forearms so that your thumbs are facing the ceiling (neutral position). Without lifting your shoulders up, squeeze your shoulder blades tightly together. Hold this position for __________ seconds. Slowly release and return to the starting position. Repeat __________ times. Complete this exercise __________ times a day. This information is not intended to replace advice given to you by your health care provider. Make sure you discuss any questions you have with your health care provider. Document Revised: 02/04/2020 Document Reviewed: 02/04/2020 Elsevier Patient Education  2024 [employer]  Document Revised: 05/25/2020 Document Reviewed: 05/25/2020 Elsevier Patient Education  2024 [employer].

## 2023-06-08 NOTE — Progress Notes (Signed)
   Subjective:    Patient ID: Kevin [name], male    DOB: October 24, 1986, 37 y.o.   MRN: [id number]  Chief Complaint  Patient presents with   Elbow Pain    Right burning stabbing pain    Fatigue    HPI PT presents to the office today with left lateral elbow pain that started two weeks ago. Denies any injury that he is aware up. 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   Reports aching pain of 7 out 10 when moving. He has taken motrin without relief.    Review of Systems  All other systems reviewed and are negative.      Objective:   Physical Exam Vitals reviewed.  Constitutional:      General: He is not in acute distress.    Appearance: He is well-developed.  HENT:     Head: Normocephalic.  Eyes:     General:        Right eye: No discharge.        Left eye: No discharge.     Pupils: Pupils are equal, round, and reactive to light.  Neck:     Thyroid: No thyromegaly.  Cardiovascular:     Rate and Rhythm: Normal rate and regular rhythm.     Heart sounds: Normal heart sounds. No murmur heard. Pulmonary:     Effort: Pulmonary effort is normal. No respiratory distress.     Breath sounds: Normal breath sounds. No wheezing.  Abdominal:     General: Bowel sounds are normal. There is no distension.     Palpations: Abdomen is soft.     Tenderness: There is no abdominal tenderness.  Musculoskeletal:        General: No tenderness. Normal range of motion.     Cervical back: Normal range of motion and neck supple.     Comments: Full ROM of left arm, pain in in lateral epicondylitis with flexion   Skin:    General: Skin is warm and dry.     Findings: No erythema or rash.  Neurological:     Mental Status: He is alert and oriented to person, place, and time.     Cranial Nerves: No cranial nerve deficit.     Deep Tendon Reflexes: Reflexes are normal and symmetric.  Psychiatric:        Behavior: Behavior normal.        Thought  Content: Thought content normal.        Judgment: Judgment normal.     BP 112/81   Pulse 84   Temp 97.7 F (36.5 C) (Temporal)   Wt 171 lb 6.4 oz (77.7 kg)   SpO2 97%   BMI 26.85 kg/m        Assessment & Plan:  Kevin Alan Pohlmann comes in today with chief complaint of Elbow Pain (Right burning stabbing pain/) and Fatigue   Diagnosis and orders addressed:  1. Lateral epicondylitis of left elbow Rest Prednisone and diclofenac BID with food No other NSAID's  Ice ROM exercises discussed, handout given Follow up as needed  - predniSONE (STERAPRED UNI-PAK 21 TAB) 10 MG (21) TBPK tablet; Use as directed  Dispense: 21 tablet; Refill: 0 - diclofenac (VOLTAREN) 75 MG EC tablet; Take 1 tablet (75 mg total) by mouth 2 (two) times daily.  Dispense: 30 tablet; Refill: 0    Jannifer Rodney, FNP

## 2023-08-29 DIAGNOSIS — M7712 Lateral epicondylitis, left elbow: Secondary | ICD-10-CM | POA: Diagnosis not present

## 2023-09-06 DIAGNOSIS — M542 Cervicalgia: Secondary | ICD-10-CM | POA: Diagnosis not present

## 2023-09-06 DIAGNOSIS — M5412 Radiculopathy, cervical region: Secondary | ICD-10-CM | POA: Diagnosis not present

## 2023-10-19 DIAGNOSIS — M5412 Radiculopathy, cervical region: Secondary | ICD-10-CM | POA: Diagnosis not present

## 2023-10-23 DIAGNOSIS — M5412 Radiculopathy, cervical region: Secondary | ICD-10-CM | POA: Diagnosis not present

## 2023-11-09 DIAGNOSIS — M542 Cervicalgia: Secondary | ICD-10-CM | POA: Diagnosis not present

## 2023-11-22 DIAGNOSIS — M542 Cervicalgia: Secondary | ICD-10-CM | POA: Diagnosis not present

## 2023-12-05 DIAGNOSIS — M5412 Radiculopathy, cervical region: Secondary | ICD-10-CM | POA: Diagnosis not present

## 2023-12-21 ENCOUNTER — Ambulatory Visit: Payer: 59 | Admitting: Family Medicine

## 2023-12-21 VITALS — BP 106/73 | Temp 98.7°F | Ht 67.0 in | Wt 178.0 lb

## 2023-12-21 DIAGNOSIS — Z01818 Encounter for other preprocedural examination: Secondary | ICD-10-CM | POA: Diagnosis not present

## 2023-12-21 NOTE — Progress Notes (Signed)
Subjective:  Patient ID: Kevin [name], male    DOB: 22-Dec-1985, 38 y.o.   MRN: [id number]  Patient Care Team: Mechele Claude, MD as PCP - General (Family Medicine)   Chief Complaint:  surgical clearance  HPI: Kevin Alan Dolinar is a 38 y.o. male presenting on 12/21/2023 for surgical clearance Neck fusion with [facility]  Has had multiple surgeries in the past. No issues with N/V after anesthesia, no respiratory issues with anesthesia.   1) High Risk Cardiac Conditions  1) Recent MI - No.  2) Decompensated Heart Failure - No.  3) Unstable angina - No.  4) Symptomatic arrythmia - No.  5) Sx Valvular Disease - No.  2) Intermediate Risk Factors - DM, CKD, CVA, CHF, CAD - No.  2) Functional Status - > 4 mets (Walk, run, climb stairs) Yes.   D[facility] Activity Status Index (DASI) from [email or web address]  on 12/21/2023  RESULT SUMMARY: 58.2 points The higher the score (maximum 58.2), the higher the functional status.  9.89 METs INPUTS: Take care of self --> 2.75 = Yes Walk indoors --> 1.75 = Yes Walk 1&ndash;2 blocks on level ground --> 2.75 = Yes Climb a flight of stairs or walk up a hill --> 5.5 = Yes Run a short distance --> 8 = Yes Do light work around the house --> 2.7 = Yes Do moderate work around the house --> 3.5 = Yes Do heavy work around the house --> 8 = Yes Do yardwork --> 4.5 = Yes Have sexual relations --> 5.25 = Yes Participate in moderate recreational activities --> 6 = Yes Participate in strenuous sports --> 7.5 = Yes  3) Surgery Specific Risk - High  (Emergency, Vascular, Intra-abdominal, Extensive ops)          Intermediate (Carotid, Head and Neck, Orthopaedic )          Low (Endoscopic, Cataract, Breast )  4) Further Noninvasive evaluation -   1) EKG - No.   1) Hx of CVA, CAD, DM, CKD  2) Echo - No.   1) Worsening dyspnea   3) Stress Testing - Active Cardiac Disease - No.  Revised Cardiac Risk Index for Pre-Operative Risk from [email or web address]  on  12/21/2023  RESULT SUMMARY: 0 points RCRI Score  3.9 % Risk of major cardiac event   INPUTS: High-risk surgery --> 0 = No History of ischemic heart disease --> 0 = No History of congestive heart failure --> 0 = No History of cerebrovascular disease --> 0 = No Pre-operative treatment with insulin --> 0 = No Pre-operative creatinine >2 mg/dL / 409.8 mol/L --> 0 = No   5) Need for medical therapy - Beta Blocker, Statins indicated ? No.   Relevant past medical, surgical, family, and social history reviewed and updated as indicated.  Allergies and medications reviewed and updated. Data reviewed: Chart in Epic.   Past Medical History:  Diagnosis Date   Arthritis    GERD (gastroesophageal reflux disease)    Hyperlipidemia 09/04/2021    Past Surgical History:  Procedure Laterality Date   APPENDECTOMY     HERNIA REPAIR Left    x 2 inguinal   Wrist surgery       Social History   Socioeconomic History   Marital status: Married    Spouse name: Not on file   Number of children: Not on file   Years of education: Not on file   Highest education level: GED or equivalent  Occupational History  Not on file  Tobacco Use   Smoking status: Every Day    Current packs/day: 1.00    Average packs/day: 1 pack/day for 8.0 years (8.0 ttl pk-yrs)    Types: Cigarettes   Smokeless tobacco: Current  Vaping Use   Vaping status: Never Used  Substance and Sexual Activity   Alcohol use: No   Drug use: No    Types: Marijuana   Sexual activity: Yes  Other Topics Concern   Not on file  Social History Narrative   Not on file   Social Drivers of Health   Financial Resource Strain: High Risk (12/21/2023)   Overall Financial Resource Strain (CARDIA)    Difficulty of Paying Living Expenses: Very hard  Food Insecurity: No Food Insecurity (12/21/2023)   Hunger Vital Sign    Worried About Running Out of Food in the Last Year: Never true    Ran Out of Food in the Last Year: Never true   Transportation Needs: Unmet Transportation Needs (12/21/2023)   PRAPARE - Administrator, Civil Service (Medical): Yes    Lack of Transportation (Non-Medical): No  Physical Activity: Sufficiently Active (12/21/2023)   Exercise Vital Sign    Days of Exercise per Week: 5 days    Minutes of Exercise per Session: 150+ min  Stress: No Stress Concern Present (12/21/2023)   [employer] of Occupational Health - Occupational Stress Questionnaire    Feeling of Stress : Only a little  Social Connections: Moderately Isolated (12/21/2023)   Social Connection and Isolation Panel [NHANES]    Frequency of Communication with Friends and Family: Twice a week    Frequency of Social Gatherings with Friends and Family: Once a week    Attends Religious Services: Never    Database administrator or Organizations: No    Attends Banker Meetings: Not on file    Marital Status: Married  Intimate Partner Violence: Not on file    Outpatient Encounter Medications as of 12/21/2023  Medication Sig   ibuprofen (ADVIL) 200 MG tablet Take by mouth.   [DISCONTINUED] diclofenac (VOLTAREN) 75 MG EC tablet Take 1 tablet (75 mg total) by mouth 2 (two) times daily.   [DISCONTINUED] predniSONE (STERAPRED UNI-PAK 21 TAB) 10 MG (21) TBPK tablet Use as directed   No facility-administered encounter medications on file as of 12/21/2023.    No Known Allergies  Review of Systems As per HPI  Objective:  BP 106/73   Temp 98.7 F (37.1 C)   Ht 5\' 7"  (1.702 m)   Wt 178 lb (80.7 kg)   SpO2 98%   BMI 27.88 kg/m    Wt Readings from Last 3 Encounters:  12/21/23 178 lb (80.7 kg)  06/08/23 171 lb 6.4 oz (77.7 kg)  09/07/22 172 lb 6.4 oz (78.2 kg)    Physical Exam Constitutional:      General: He is awake. He is not in acute distress.    Appearance: Normal appearance. He is well-developed and well-groomed. He is not ill-appearing, toxic-appearing or diaphoretic.  Cardiovascular:     Rate and  Rhythm: Normal rate and regular rhythm.     Pulses: Normal pulses.          Radial pulses are 2+ on the right side and 2+ on the left side.       Posterior tibial pulses are 2+ on the right side and 2+ on the left side.     Heart sounds: Normal heart sounds. No murmur heard.  No gallop.  Pulmonary:     Effort: Pulmonary effort is normal. No respiratory distress.     Breath sounds: Normal breath sounds. No stridor. No wheezing, rhonchi or rales.  Musculoskeletal:     Cervical back: Full passive range of motion without pain and neck supple.     Right lower leg: No edema.     Left lower leg: No edema.  Skin:    General: Skin is warm.     Capillary Refill: Capillary refill takes less than 2 seconds.  Neurological:     General: No focal deficit present.     Mental Status: He is alert, oriented to person, place, and time and easily aroused. Mental status is at baseline.     GCS: GCS eye subscore is 4. GCS verbal subscore is 5. GCS motor subscore is 6.     Motor: No weakness.  Psychiatric:        Attention and Perception: Attention and perception normal.        Mood and Affect: Mood and affect normal.        Speech: Speech normal.        Behavior: Behavior normal. Behavior is cooperative.        Thought Content: Thought content normal. Thought content does not include homicidal or suicidal ideation. Thought content does not include homicidal or suicidal plan.        Cognition and Memory: Cognition and memory normal.        Judgment: Judgment normal.     Results for orders placed or performed in visit on 09/02/21  Bayer DCA Hb A1c Waived   Collection Time: 09/02/21  4:35 PM  Result Value Ref Range   HB A1C (BAYER DCA - WAIVED) 5.0 4.8 - 5.6 %  CBC with Differential/Platelet   Collection Time: 09/02/21  4:39 PM  Result Value Ref Range   WBC 7.5 3.4 - 10.8 x10E3/uL   RBC 5.07 4.14 - 5.80 x10E6/uL   Hemoglobin 15.3 13.0 - 17.7 g/dL   Hematocrit 29.5 62.1 - 51.0 %   MCV 89 79 - 97  fL   MCH 30.2 26.6 - 33.0 pg   MCHC 34.1 31.5 - 35.7 g/dL   RDW 30.8 65.7 - 84.6 %   Platelets 304 150 - 450 x10E3/uL   Neutrophils 53 Not Estab. %   Lymphs 37 Not Estab. %   Monocytes 8 Not Estab. %   Eos 2 Not Estab. %   Basos 0 Not Estab. %   Neutrophils Absolute 3.9 1.4 - 7.0 x10E3/uL   Lymphocytes Absolute 2.8 0.7 - 3.1 x10E3/uL   Monocytes Absolute 0.6 0.1 - 0.9 x10E3/uL   EOS (ABSOLUTE) 0.2 0.0 - 0.4 x10E3/uL   Basophils Absolute 0.0 0.0 - 0.2 x10E3/uL   Immature Granulocytes 0 Not Estab. %   Immature Grans (Abs) 0.0 0.0 - 0.1 x10E3/uL  CMP14+EGFR   Collection Time: 09/02/21  4:39 PM  Result Value Ref Range   Glucose 81 70 - 99 mg/dL   BUN 16 6 - 20 mg/dL   Creatinine, Ser 9.62 0.76 - 1.27 mg/dL   eGFR 93 >95 MW/UXL/2.44   BUN/Creatinine Ratio 15 9 - 20   Sodium 140 134 - 144 mmol/L   Potassium 4.5 3.5 - 5.2 mmol/L   Chloride 104 96 - 106 mmol/L   CO2 24 20 - 29 mmol/L   Calcium 9.0 8.7 - 10.2 mg/dL   Total Protein 7.1 6.0 - 8.5 g/dL   Albumin 4.4 4.0 -  5.0 g/dL   Globulin, Total 2.7 1.5 - 4.5 g/dL   Albumin/Globulin Ratio 1.6 1.2 - 2.2   Bilirubin Total 0.3 0.0 - 1.2 mg/dL   Alkaline Phosphatase 60 44 - 121 IU/L   AST 20 0 - 40 IU/L   ALT 26 0 - 44 IU/L  Lipid panel   Collection Time: 09/02/21  4:39 PM  Result Value Ref Range   Cholesterol, Total 224 (H) 100 - 199 mg/dL   Triglycerides 295 (H) 0 - 149 mg/dL   HDL 26 (L) >62 mg/dL   VLDL Cholesterol Cal 32 5 - 40 mg/dL   LDL Chol Calc (NIH) 130 (H) 0 - 99 mg/dL   Chol/HDL Ratio 8.6 (H) 0.0 - 5.0 ratio       09/07/2022    2:20 PM 09/02/2021    4:14 PM 06/23/2021    3:38 PM 06/20/2021    9:38 AM 12/31/2020    3:00 PM  Depression screen PHQ 2/9  Decreased Interest 0 0 0 0 0  Down, Depressed, Hopeless 1 0 0 0 0  PHQ - 2 Score 1 0 0 0 0  Altered sleeping 0 3 1 1    Tired, decreased energy 1 0 0 1   Change in appetite 0 0 0 0   Feeling bad or failure about yourself  0 0 0 0   Trouble concentrating 0 1 1 0    Moving slowly or fidgety/restless 1 0 0 0   Suicidal thoughts 0 0 0 0   PHQ-9 Score 3 4 2 2    Difficult doing work/chores Somewhat difficult Somewhat difficult Not difficult at all Not difficult at all        09/07/2022    2:20 PM 09/02/2021    4:14 PM 06/23/2021    3:38 PM 06/20/2021    9:39 AM  GAD 7 : Generalized Anxiety Score  Nervous, Anxious, on Edge 0 0 1 0  Control/stop worrying 0 0 1 0  Worry too much - different things 0 1 0 1  Trouble relaxing 1 2 1 1   Restless 0 0 0 0  Easily annoyed or irritable 0 1 0 0  Afraid - awful might happen 0 0 0 0  Total GAD 7 Score 1 4 3 2   Anxiety Difficulty Somewhat difficult Somewhat difficult Not difficult at all Not difficult at all   Pertinent labs & imaging results that were available during my care of the patient were reviewed by me and considered in my medical decision making.  Assessment & Plan:  Kevin was seen today for surgical clearance.  Diagnoses and all orders for this visit:  Preop examination I have independently evaluated patient.  Patient is low risk for a intermediate risk surgery.  There are modifiable risk factors (smoking, and hyperlipidemia). RCRI is 0, 3.9% Reviewed DASI score as above.   Does not meet criteria for CXR (if asx/healthy/no resp issues don't need) Does not meet criteria for EKG (?h/o MI, CAD, etc; usually don't need for low risk sx) Does not meet criteria for PFTs (significant cardiopulm hx? OSA/OHS) Does not meet criteria for Echo (get if CHF and has not had in >1 year OR if worse HF symptoms) Reviewed meds: patient is not taking ACE-I/ARB or BB.   Continue all other maintenance medications.  Follow up plan: Return if symptoms worsen or fail to improve.  Continue healthy lifestyle choices, including diet (rich in fruits, vegetables, and lean proteins, and low in salt and simple carbohydrates)  and exercise (at least 30 minutes of moderate physical activity daily).  Written and verbal  instructions provided   The above assessment and management plan was discussed with the patient. The patient verbalized understanding of and has agreed to the management plan. Patient is aware to call the clinic if they develop any new symptoms or if symptoms persist or worsen. Patient is aware when to return to the clinic for a follow-up visit. Patient educated on when it is appropriate to go to the emergency department.   Neale Burly, DNP-FNP [facility] [address] [place], [place] [address] [phone]

## 2023-12-22 LAB — CBC WITH DIFFERENTIAL/PLATELET
Basophils Absolute: 0 10*3/uL (ref 0.0–0.2)
Basos: 0 %
EOS (ABSOLUTE): 0.2 10*3/uL (ref 0.0–0.4)
Eos: 3 %
Hematocrit: 46.3 % (ref 37.5–51.0)
Hemoglobin: 15.8 g/dL (ref 13.0–17.7)
Immature Grans (Abs): 0 10*3/uL (ref 0.0–0.1)
Immature Granulocytes: 0 %
Lymphocytes Absolute: 2.4 10*3/uL (ref 0.7–3.1)
Lymphs: 33 %
MCH: 31.4 pg (ref 26.6–33.0)
MCHC: 34.1 g/dL (ref 31.5–35.7)
MCV: 92 fL (ref 79–97)
Monocytes Absolute: 0.6 10*3/uL (ref 0.1–0.9)
Monocytes: 9 %
Neutrophils Absolute: 4 10*3/uL (ref 1.4–7.0)
Neutrophils: 55 %
Platelets: 307 10*3/uL (ref 150–450)
RBC: 5.03 x10E6/uL (ref 4.14–5.80)
RDW: 13 % (ref 11.6–15.4)
WBC: 7.2 10*3/uL (ref 3.4–10.8)

## 2023-12-22 LAB — CMP14+EGFR
ALT: 30 [IU]/L (ref 0–44)
AST: 25 [IU]/L (ref 0–40)
Albumin: 4.2 g/dL (ref 4.1–5.1)
Alkaline Phosphatase: 67 [IU]/L (ref 44–121)
BUN/Creatinine Ratio: 24 — ABNORMAL HIGH (ref 9–20)
BUN: 11 mg/dL (ref 6–20)
Bilirubin Total: 0.2 mg/dL (ref 0.0–1.2)
CO2: 20 mmol/L (ref 20–29)
Calcium: 9.4 mg/dL (ref 8.7–10.2)
Chloride: 103 mmol/L (ref 96–106)
Creatinine, Ser: 0.46 mg/dL — ABNORMAL LOW (ref 0.76–1.27)
Globulin, Total: 2.4 g/dL (ref 1.5–4.5)
Glucose: 78 mg/dL (ref 70–99)
Potassium: 4.1 mmol/L (ref 3.5–5.2)
Sodium: 141 mmol/L (ref 134–144)
Total Protein: 6.6 g/dL (ref 6.0–8.5)
eGFR: 136 mL/min/{1.73_m2} (ref >59)

## 2023-12-24 ENCOUNTER — Encounter: Payer: Self-pay | Admitting: Family Medicine

## 2023-12-25 NOTE — Progress Notes (Signed)
Mild variation on creatinine, but GFR looks great. Surgery eval is complete from our end. Next steps and approval for surgery is ultimately up to the surgeon.

## 2024-01-04 DIAGNOSIS — M50123 Cervical disc disorder at C6-C7 level with radiculopathy: Secondary | ICD-10-CM | POA: Diagnosis not present

## 2024-02-19 DIAGNOSIS — Z4889 Encounter for other specified surgical aftercare: Secondary | ICD-10-CM | POA: Diagnosis not present

## 2024-03-25 DIAGNOSIS — G8918 Other acute postprocedural pain: Secondary | ICD-10-CM | POA: Diagnosis not present

## 2024-04-11 DIAGNOSIS — M542 Cervicalgia: Secondary | ICD-10-CM | POA: Diagnosis not present

## 2024-06-12 ENCOUNTER — Ambulatory Visit: Admitting: Family Medicine

## 2024-06-12 ENCOUNTER — Ambulatory Visit: Payer: Self-pay | Admitting: Family Medicine

## 2024-06-12 ENCOUNTER — Ambulatory Visit: Payer: Self-pay

## 2024-06-12 ENCOUNTER — Encounter: Payer: Self-pay | Admitting: Family Medicine

## 2024-06-12 ENCOUNTER — Ambulatory Visit (INDEPENDENT_AMBULATORY_CARE_PROVIDER_SITE_OTHER)

## 2024-06-12 VITALS — BP 120/80 | HR 79 | Temp 97.3°F | Ht 67.0 in | Wt 178.8 lb

## 2024-06-12 DIAGNOSIS — W19XXXA Unspecified fall, initial encounter: Secondary | ICD-10-CM | POA: Diagnosis not present

## 2024-06-12 DIAGNOSIS — R252 Cramp and spasm: Secondary | ICD-10-CM

## 2024-06-12 DIAGNOSIS — M542 Cervicalgia: Secondary | ICD-10-CM

## 2024-06-12 LAB — URINALYSIS, ROUTINE W REFLEX MICROSCOPIC
Bilirubin, UA: NEGATIVE
Ketones, UA: NEGATIVE
Leukocytes,UA: NEGATIVE
Nitrite, UA: NEGATIVE
RBC, UA: NEGATIVE
Specific Gravity, UA: 1.02 (ref 1.005–1.030)
Urobilinogen, Ur: >8 mg/dL — ABNORMAL HIGH (ref 0.2–1.0)
pH, UA: 7.5 (ref 5.0–7.5)

## 2024-06-12 LAB — MICROSCOPIC EXAMINATION
Bacteria, UA: NONE SEEN
Epithelial Cells (non renal): NONE SEEN /HPF (ref 0–10)
RBC, Urine: NONE SEEN /HPF (ref 0–2)
Renal Epithel, UA: NONE SEEN /HPF
WBC, UA: NONE SEEN /HPF (ref 0–5)
Yeast, UA: NONE SEEN

## 2024-06-12 NOTE — Telephone Encounter (Signed)
 Apt scheduled.

## 2024-06-12 NOTE — Progress Notes (Signed)
 Subjective:  Patient ID: Kevin [name], male    DOB: 1986-10-13, 38 y.o.   MRN: [id number]  Patient Care Team: Zollie Lowers, MD as PCP - General (Family Medicine)   Chief Complaint:  Neck Pain, Back Pain (After a fall Sunday), and muscle cramps  (Extreme thirst x 3 days )   HPI: Kevin [name] is a 38 y.o. male presenting on 06/12/2024 for Neck Pain, Back Pain (After a fall Sunday), and muscle cramps  (Extreme thirst x 3 days )   Kevin [name] is a 38 year old male who presents with shoulder pain and cramping after a fall.  He experienced shoulder pain and cramping after a fall on Sunday, during which he hit his right shoulder hard and felt as though 'something kind of popped.'  Over the past three to four days, he has experienced cramping and sharp pains in the upper neck and shoulders, along with weakness and more frequent headaches. He has been taking Advil  for pain relief but does not use other pain medications.  He works outside and drinks water and Gatorade during work hours, consuming at least seven bottles of water a day, but switches to soda and tea at home. He flavors his water with sugar-free Sonic pouches. He notes that his urine is clear and bowel movements are normal. He is concerned that his symptoms may be related to working in the heat and wonders if dehydration could be a factor.          Relevant past medical, surgical, family, and social history reviewed and updated as indicated.  Allergies and medications reviewed and updated. Data reviewed: Chart in Epic.   Past Medical History:  Diagnosis Date   Arthritis    GERD (gastroesophageal reflux disease)    Hyperlipidemia 09/04/2021    Past Surgical History:  Procedure Laterality Date   APPENDECTOMY     HERNIA REPAIR Left    x 2 inguinal   Wrist surgery       Social History   Socioeconomic History   Marital status: Married    Spouse name: Not on file   Number of children: Not on file   Years  of education: Not on file   Highest education level: GED or equivalent  Occupational History   Not on file  Tobacco Use   Smoking status: Every Day    Current packs/day: 1.00    Average packs/day: 1 pack/day for 8.0 years (8.0 ttl pk-yrs)    Types: Cigarettes   Smokeless tobacco: Current  Vaping Use   Vaping status: Never Used  Substance and Sexual Activity   Alcohol use: No   Drug use: No    Types: Marijuana   Sexual activity: Yes  Other Topics Concern   Not on file  Social History Narrative   Not on file   Social Drivers of Health   Financial Resource Strain: High Risk (12/21/2023)   Overall Financial Resource Strain (CARDIA)    Difficulty of Paying Living Expenses: Very hard  Food Insecurity: No Food Insecurity (12/21/2023)   Hunger Vital Sign    Worried About Running Out of Food in the Last Year: Never true    Ran Out of Food in the Last Year: Never true  Transportation Needs: Unmet Transportation Needs (12/21/2023)   PRAPARE - Administrator, Civil Service (Medical): Yes    Lack of Transportation (Non-Medical): No  Physical Activity: Sufficiently Active (12/21/2023)   Exercise Vital Sign  Days of Exercise per Week: 5 days    Minutes of Exercise per Session: 150+ min  Stress: No Stress Concern Present (12/21/2023)   [employer] of Occupational Health - Occupational Stress Questionnaire    Feeling of Stress : Only a little  Social Connections: Moderately Isolated (12/21/2023)   Social Connection and Isolation Panel    Frequency of Communication with Friends and Family: Twice a week    Frequency of Social Gatherings with Friends and Family: Once a week    Attends Religious Services: Never    Database administrator or Organizations: No    Attends Banker Meetings: Not on file    Marital Status: Married  Intimate Partner Violence: Not on file    Outpatient Encounter Medications as of 06/12/2024  Medication Sig   ibuprofen  (ADVIL ) 200 MG  tablet Take by mouth.   No facility-administered encounter medications on file as of 06/12/2024.    No Known Allergies  Pertinent ROS per HPI, otherwise unremarkable      Objective:  BP 120/80   Pulse 79   Temp (!) 97.3 F (36.3 C)   Ht 5' 7 (1.702 m)   Wt 178 lb 12.8 oz (81.1 kg)   SpO2 98%   BMI 28.00 kg/m    Wt Readings from Last 3 Encounters:  06/12/24 178 lb 12.8 oz (81.1 kg)  12/21/23 178 lb (80.7 kg)  06/08/23 171 lb 6.4 oz (77.7 kg)    Physical Exam Vitals and nursing note reviewed.  Constitutional:      Appearance: Normal appearance.  HENT:     Head: Normocephalic and atraumatic.     Nose: Nose normal.     Mouth/Throat:     Mouth: Mucous membranes are moist.     Pharynx: Oropharynx is clear.  Eyes:     Conjunctiva/sclera: Conjunctivae normal.     Pupils: Pupils are equal, round, and reactive to light.  Cardiovascular:     Rate and Rhythm: Normal rate and regular rhythm.     Heart sounds: Normal heart sounds.  Pulmonary:     Effort: Pulmonary effort is normal.     Breath sounds: Normal breath sounds.  Musculoskeletal:        General: Normal range of motion.     Right shoulder: Normal.     Left shoulder: Normal.     Cervical back: Neck supple. No swelling, edema, deformity, erythema, signs of trauma, lacerations, rigidity, spasms, torticollis, tenderness, bony tenderness or crepitus. No pain with movement.     Right lower leg: No edema.     Left lower leg: No edema.  Skin:    General: Skin is warm and dry.     Capillary Refill: Capillary refill takes less than 2 seconds.  Neurological:     General: No focal deficit present.     Mental Status: He is alert and oriented to person, place, and time.     Cranial Nerves: Cranial nerves 2-12 are intact.     Sensory: Sensation is intact.     Motor: Motor function is intact.  Psychiatric:        Mood and Affect: Mood normal.        Behavior: Behavior normal.        Thought Content: Thought content  normal.        Judgment: Judgment normal.      Results for orders placed or performed in visit on 12/21/23  CBC with Differential/Platelet   Collection Time: 12/21/23  3:12 PM  Result Value Ref Range   WBC 7.2 3.4 - 10.8 x10E3/uL   RBC 5.03 4.14 - 5.80 x10E6/uL   Hemoglobin 15.8 13.0 - 17.7 g/dL   Hematocrit 53.6 62.4 - 51.0 %   MCV 92 79 - 97 fL   MCH 31.4 26.6 - 33.0 pg   MCHC 34.1 31.5 - 35.7 g/dL   RDW 86.9 88.3 - 84.5 %   Platelets 307 150 - 450 x10E3/uL   Neutrophils 55 Not Estab. %   Lymphs 33 Not Estab. %   Monocytes 9 Not Estab. %   Eos 3 Not Estab. %   Basos 0 Not Estab. %   Neutrophils Absolute 4.0 1.4 - 7.0 x10E3/uL   Lymphocytes Absolute 2.4 0.7 - 3.1 x10E3/uL   Monocytes Absolute 0.6 0.1 - 0.9 x10E3/uL   EOS (ABSOLUTE) 0.2 0.0 - 0.4 x10E3/uL   Basophils Absolute 0.0 0.0 - 0.2 x10E3/uL   Immature Granulocytes 0 Not Estab. %   Immature Grans (Abs) 0.0 0.0 - 0.1 x10E3/uL  CMP14+EGFR   Collection Time: 12/21/23  3:12 PM  Result Value Ref Range   Glucose 78 70 - 99 mg/dL   BUN 11 6 - 20 mg/dL   Creatinine, Ser 9.53 (L) 0.76 - 1.27 mg/dL   eGFR 863 >40 fO/fpw/8.26   BUN/Creatinine Ratio 24 (H) 9 - 20   Sodium 141 134 - 144 mmol/L   Potassium 4.1 3.5 - 5.2 mmol/L   Chloride 103 96 - 106 mmol/L   CO2 20 20 - 29 mmol/L   Calcium 9.4 8.7 - 10.2 mg/dL   Total Protein 6.6 6.0 - 8.5 g/dL   Albumin 4.2 4.1 - 5.1 g/dL   Globulin, Total 2.4 1.5 - 4.5 g/dL   Bilirubin Total 0.2 0.0 - 1.2 mg/dL   Alkaline Phosphatase 67 44 - 121 IU/L   AST 25 0 - 40 IU/L   ALT 30 0 - 44 IU/L     X-Ray: C-Spine: hardware appears intact. No acute findings. Preliminary x-ray reading by Rosaline Bruns, FNP-C, [facility].   Pertinent labs & imaging results that were available during my care of the patient were reviewed by me and considered in my medical decision making.  Assessment & Plan:  Kevin was seen today for neck pain, back pain and muscle cramps .  Diagnoses and all orders for this  visit:  Fall, initial encounter -     DG Cervical Spine Complete  Muscle cramps -     BMP8+EGFR -     Magnesium -     Urinalysis, Routine w reflex microscopic  Neck pain -     DG Cervical Spine Complete      Right Shoulder Pain Acute right shoulder pain following a fall on Sunday with a sensation of popping in the shoulder. Concern for potential injury to the shoulder, possibly affecting neck hardware due to recent neck surgery in February. No direct head impact reported. - Order shoulder X-ray to assess for any injury or displacement of hardware - Forward imaging results to Dr. Burnetta for further evaluation of neck hardware stability  Dehydration Symptoms of cramping, weakness, and fatigue suggest possible dehydration, especially given his outdoor work environment and recent heat exposure. Reports adequate water intake but also consumes Gatorade and other beverages. Urine appears normal, but symptoms persist. - Check blood work to assess electrolyte levels, including potassium, magnesium, and calcium - Advise to drink body weight in water per day, especially when outside in the heat, and  to follow one Gatorade with four bottles of water  Headaches Reports more frequent headaches recently, possibly related to dehydration or neck issues. Has been taking Advil  for pain management. - Evaluate blood work results to determine if dehydration is contributing to headaches        Continue all other maintenance medications.  Follow up plan: Return if symptoms worsen or fail to improve.    The above assessment and management plan was discussed with the patient. The patient verbalized understanding of and has agreed to the management plan. Patient is aware to call the clinic if they develop any new symptoms or if symptoms persist or worsen. Patient is aware when to return to the clinic for a follow-up visit. Patient educated on when it is appropriate to go to the emergency department.    Rosaline Bruns, FNP-C [facility] [phone]

## 2024-06-12 NOTE — Telephone Encounter (Signed)
 FYI Only or Action Required?: FYI only for provider.  Patient was last seen in primary care on 12/21/2023 by Cathlene Marry Lenis, FNP.  Called Nurse Triage reporting Pain.  Symptoms began several days ago.  Interventions attempted: Rest, hydration, or home remedies.  Symptoms are: unchanged.  Triage Disposition: See PCP When Office is Open (Within 3 Days)  Patient/caregiver understands and will follow disposition?: Yes  Copied from CRM [id number]. Topic: Clinical - Red Word Triage >> Jun 12, 2024  9:01 AM Wess RAMAN wrote: Red Word that prompted transfer to Nurse Triage: Patient stated he fell on Sunday from possible heat exhaustion. Pain in upper neck and back. Pain level 6. Constant thirst. Reason for Disposition  [1] MODERATE back pain (e.g., interferes with normal activities) AND [2] present > 3 days  Answer Assessment - Initial Assessment Questions 1. ONSET: When did the pain begin? (e.g., minutes, hours, days)     Upper back  2. LOCATION: Where does it hurt? (upper, mid or lower back)     Upper back near neck 3. SEVERITY: How bad is the pain?  (e.g., Scale 1-10; mild, moderate, or severe)     Moderate 4. PATTERN: Is the pain constant? (e.g., yes, no; constant, intermittent)      Constant, cramping 5. RADIATION: Does the pain shoot into your legs or somewhere else?     neck 6. CAUSE:  What do you think is causing the back pain?      Had a fall Sunday. Also wondering if maybe heat exhaustion 7. BACK OVERUSE:  Any recent lifting of heavy objects, strenuous work or exercise?     denies 8. MEDICINES: What have you taken so far for the pain? (e.g., nothing, acetaminophen , NSAIDS)      9. NEUROLOGIC SYMPTOMS: Do you have any weakness, numbness, or problems with bowel/bladder control?     Weakness in upper body from pain 10. OTHER SYMPTOMS: Do you have any other symptoms? (e.g., fever, abdomen pain, burning with urination, blood in urine)        Denies  Additional info: Sustained fall on Sunday which he states he feels was from head exhaustion, he has increased thirst. Patient did not want to triage further, requesting appointment in office today. He took day off work for evaluation. History of neck surgery, he is requesting appointment today for evaluation and xray to make sure he didn't displace any hardware.  Protocols used: Back Pain-A-AH

## 2024-06-13 ENCOUNTER — Encounter: Payer: Self-pay | Admitting: Advanced Practice Midwife

## 2024-06-13 LAB — BMP8+EGFR
BUN/Creatinine Ratio: 19 (ref 9–20)
BUN: 14 mg/dL (ref 6–24)
CO2: 21 mmol/L (ref 20–29)
Calcium: 9.2 mg/dL (ref 8.7–10.2)
Chloride: 105 mmol/L (ref 96–106)
Creatinine, Ser: 0.75 mg/dL — ABNORMAL LOW (ref 0.76–1.27)
Glucose: 80 mg/dL (ref 70–99)
Potassium: 4.2 mmol/L (ref 3.5–5.2)
Sodium: 139 mmol/L (ref 134–144)
eGFR: 117 mL/min/1.73 (ref >59)

## 2024-06-13 LAB — MAGNESIUM: Magnesium: 2.1 mg/dL (ref 1.6–2.3)
